# Patient Record
Sex: Female | Born: 1950
Health system: Southern US, Community
[De-identification: ages and names within clinical notes are randomized; demographics above are authoritative.]

## PROBLEM LIST (undated history)

## (undated) DIAGNOSIS — I10 Essential (primary) hypertension: Secondary | ICD-10-CM

## (undated) DIAGNOSIS — E785 Hyperlipidemia, unspecified: Secondary | ICD-10-CM

## (undated) DIAGNOSIS — K589 Irritable bowel syndrome without diarrhea: Secondary | ICD-10-CM

## (undated) HISTORY — DX: Essential (primary) hypertension: I10

## (undated) HISTORY — PX: ABDOMINAL HYSTERECTOMY: SHX81

## (undated) HISTORY — DX: Irritable bowel syndrome, unspecified: K58.9

## (undated) HISTORY — DX: Hyperlipidemia, unspecified: E78.5

## (undated) HISTORY — PX: COSMETIC SURGERY: SHX468

---

## 1999-11-13 ENCOUNTER — Encounter: Payer: Self-pay | Admitting: Emergency Medicine

## 1999-11-13 ENCOUNTER — Observation Stay (HOSPITAL_COMMUNITY): Admission: EM | Admit: 1999-11-13 | Discharge: 1999-11-14 | Payer: Self-pay | Admitting: Emergency Medicine

## 1999-11-13 ENCOUNTER — Encounter: Payer: Self-pay | Admitting: Orthopedic Surgery

## 2000-01-23 ENCOUNTER — Ambulatory Visit (HOSPITAL_BASED_OUTPATIENT_CLINIC_OR_DEPARTMENT_OTHER): Admission: RE | Admit: 2000-01-23 | Discharge: 2000-01-23 | Payer: Self-pay | Admitting: Orthopedic Surgery

## 2001-03-20 ENCOUNTER — Ambulatory Visit (HOSPITAL_BASED_OUTPATIENT_CLINIC_OR_DEPARTMENT_OTHER): Admission: RE | Admit: 2001-03-20 | Discharge: 2001-03-20 | Payer: Self-pay | Admitting: Orthopedic Surgery

## 2006-04-08 ENCOUNTER — Other Ambulatory Visit: Admission: RE | Admit: 2006-04-08 | Discharge: 2006-04-08 | Payer: Self-pay | Admitting: Obstetrics and Gynecology

## 2006-04-22 ENCOUNTER — Encounter: Admission: RE | Admit: 2006-04-22 | Discharge: 2006-04-22 | Payer: Self-pay | Admitting: Obstetrics and Gynecology

## 2006-04-29 ENCOUNTER — Encounter: Admission: RE | Admit: 2006-04-29 | Discharge: 2006-04-29 | Payer: Self-pay | Admitting: Obstetrics and Gynecology

## 2007-06-17 ENCOUNTER — Encounter: Admission: RE | Admit: 2007-06-17 | Discharge: 2007-06-17 | Payer: Self-pay | Admitting: Obstetrics and Gynecology

## 2008-06-23 ENCOUNTER — Encounter: Admission: RE | Admit: 2008-06-23 | Discharge: 2008-06-23 | Payer: Self-pay | Admitting: Obstetrics and Gynecology

## 2009-06-30 ENCOUNTER — Encounter: Admission: RE | Admit: 2009-06-30 | Discharge: 2009-06-30 | Payer: Self-pay | Admitting: Obstetrics and Gynecology

## 2009-08-03 ENCOUNTER — Encounter: Admission: RE | Admit: 2009-08-03 | Discharge: 2009-08-03 | Payer: Self-pay | Admitting: Internal Medicine

## 2010-02-04 ENCOUNTER — Encounter: Payer: Self-pay | Admitting: Obstetrics and Gynecology

## 2010-06-01 NOTE — Op Note (Signed)
[facility]  Patient:    Carla Welch, Carla Welch                       MRN: [id number] Proc. Date: 11/13/99 Adm. Date:  [id number] Attending:  Milly Jakob                           Operative Report  PREOPERATIVE DIAGNOSIS:  Trimalleolar ankle fracture and dislocation with syndesmotic disruption.  POSTOPERATIVE DIAGNOSIS:  Trimalleolar ankle fracture and dislocation with syndesmotic disruption.  PRINCIPAL PROCEDURE: 1. Open reduction and internal fixation of trimalleolar ankle fracture. 2. Open reduction and internal fixation of syndesmotic disruption.  SURGEON:  Harvie Junior, M.D.  ASSISTANT:  Currie Paris. Thedore Mins.  ANESTHESIA:  General.  BRIEF HISTORY:  The patient is a 60 year old female with a history of stepping off of a step and suffering a fracture and dislocation of the ankle.  She was evaluated in the emergency room where a consult found her to have a fracture dislocation of the ankle.  We talked about treatment options involved and the patient was taken to the operating room for open reduction and internal fixation of this.  DESCRIPTION OF PROCEDURE:  The patient was taken to the operating room and after adequate anesthesia was obtained with general anesthetic, the patient was placed supine on the operating table.  The left ankle was then prepped and draped in the usual sterile fashion and after manipulative closed reduction, an incision was made over the fibular fracture.  The subcutaneous tissue was dissected down to the level of the fibular fracture which was identified. There was a large posterior butterfly piece.  The remaining portion of the fibula was anatomically aligned and attention was then turned medially.  The medial malleolus was then exposed and very interestingly, there was a coronal split in the medial malleolus that did track around posteriorly.  The piece was not big enough for screw fixation and wire fixation was used  at this point.  Two 6-2 K-wires were advanced across the fracture site and then these were folded and cut off and advanced in to hold the fracture stable.  The joint line was anatomically reduced when this was undertaken.  At this point, attention was turned back to the lateral side where a 10-hole one-third tubular plate was used to span the butterfly fragment.  Three purchases were made above the fracture site and then four below with two empty holes.  The remaining screw hole was used for syndesmotic screw fixation.  Once the fibula was anatomically reduced, the _____ was used and with the foot in maximum dorsiflexion, a 4.0 cancellous screw was advanced across the fibula to reduce the syndesmosis and hold it reduced.  The large butterfly piece was then anatomically reduced and held in place with a Vicryl cerclage wire, two above and two below the main fracture along the fibula.  At this point, the wounds were copiously irrigated and suctioned dry.  The periosteum was then closed on the medial side with a 3-0 Vicryl interrupted suture, the subcutaneous with a 3-0 Vicryl suture, and the skin with skin staples.  On the lateral side, the subcutaneous was closed with 2-0 Vicryl and skin staples.  A sterile compressive dressing was applied as well as _____ and posterior splint.  The patient was then taken to the recovery room where she was noted to be in satisfactory condition.  Estimated blood loss  for the procedure was none. DD:  11/13/99 TD:  11/14/99 Job: [id number]

## 2010-06-01 NOTE — Op Note (Signed)
[place]  Patient:    Carla Welch, LUPA Visit Number: [id number] MRN: [id number]          Service Type: DSU Location: [facility] Attending Physician:  Milly Jakob Dictated by:   Harvie Junior, M.D. Proc. Date: 03/20/01 Admit Date:  03/20/2001 Discharge Date: 03/20/2001   CC:         Leroy Sea., M.D.   Operative Report  PREOPERATIVE DIAGNOSES: 1. Painful ankle. 2. Status post open reduction and internal fixation of severe ankle    fracture-dislocation. 3. Retained hardware medial and lateral.  POSTOPERATIVE DIAGNOSES: 1. Painful ankle. 2. Status post open reduction and internal fixation of severe ankle    fracture-dislocation. 3. Retained hardware medial and lateral. 4. Split longitudinal tear of the posterior tibial tendon.  PROCEDURES: 1. Ankle arthroscopy with debridement of chondral injury, primarily on the    medial side, and removal of scar tissue and lateral band. 2. Removal of medial hardware. 3. Removal of lateral hardware. 4. Repair of posterior tibial tendon split tear.  SURGEON:  Harvie Junior, M.D.  ASSISTANT:  Currie Paris. Thedore Mins.  ANESTHESIA:  General.  BRIEF HISTORY:  She is a 60 year old female with a long history of having a severe fracture-dislocation of the ankle.  It was ultimately treated with open reduction and internal fixation with syndesmotic screw placement and ultimately went on to have an excellent result.  She had the syndesmotic screw removed and ultimately went on to get back to ambulating.  She has still had sort of chronic achy pain, mostly on the medial side, both the anterior medial and posterior medial.  Because of continued significant pain, she was worked up with injection of the peroneal tendon sheath.  This seemed to help.  She also had injection of the joint, which seemed to help.  Because of just continued nagging, aching pain, the patient felt that ankle arthroscopy  and hardware removal was appropriate.  She is brought to the operating room for this procedure.  DESCRIPTION OF PROCEDURE:  The patient was brought to the operating room and after an adequate level of anesthesia was obtained with a general anesthetic, the patient was placed upon the operating table.  The left leg was prepped and draped in the usual sterile fashion.  Following Esmarch exsanguination, a blood pressure tourniquet was inflated to 350 mmHg.  Following this, ankle arthroscopy was performed.  Ankle arthroscopy revealed that there was a significant chondral injury on the medial side, both on the tibia, talus, and in the medial gutter.  This was debrided with a 3.5 Gator.  Attention was then turned laterally, where there was a significant lateral impinging band.  This was debrided with a straight biter and the remainder of the band was removed with a suction shaver.  Following this, a final check was made laterally.  The articular cartilage looked pretty good, including into the lateral gutter. Medially there was significant chondral injury, which was smoothed down.  At this point the ankle arthroscopy instruments were removed and the ankle was suctioned dry and attention was turned medially.  An incision was made through the old incision medially and subcutaneous tissue was taken down to the level of the posterior medial aspect.  The posterior tibial tendon sheath was opened because under [facility] guidance it felt that this was where the pins were located. Interestingly, both pins were actually within the substance of the posterior tibial tendon, the bent ends of these.  These had actually torn a longitudinal rent within the posterior tibial tendon with significant granulation tissue. At this point the pins were removed and then attention was turned toward the posterior tibial tendon, which had the significant split tendon.  At this point the wounds was copiously irrigated and  suctioned dry.  The posterior tibial tendon was then identified, and a 2-0 Vicryl running locking suture was used to repair the split tear of the posterior tibial tendon.  At this point the posterior tibial tendon sheath was then closed with 2-0 Vicryl.  An excellent repair of the sheath was obtained.  Attention was then turned laterally, where three stab incisions were made.  These were dissected down to the level of the plate, and the screws were all removed through the three stab incisions.  The plate was then removed through the inferiormost incision. Fluoroscopic guidance was used throughout to make sure that we were localizing our incisions well.  At this point the wounds were all copiously irrigated and suctioned dry.  The wounds were then closed with nylon and the portals for the arthroscopy were closed with nylon interrupted sutures with a deep suture on the anterior medial border. Dictated by:   Harvie Junior, M.D. Attending Physician:  Milly Jakob DD:  03/20/01 TD:  03/23/01 Job: [id number]

## 2010-06-12 ENCOUNTER — Other Ambulatory Visit: Payer: Self-pay | Admitting: Obstetrics and Gynecology

## 2010-06-12 DIAGNOSIS — Z1231 Encounter for screening mammogram for malignant neoplasm of breast: Secondary | ICD-10-CM

## 2010-07-06 ENCOUNTER — Ambulatory Visit
Admission: RE | Admit: 2010-07-06 | Discharge: 2010-07-06 | Disposition: A | Discharge status: Home / Self Care | Payer: BC Managed Care – PPO | Source: Ambulatory Visit | Attending: Obstetrics and Gynecology | Admitting: Obstetrics and Gynecology

## 2010-07-06 DIAGNOSIS — Z1231 Encounter for screening mammogram for malignant neoplasm of breast: Secondary | ICD-10-CM

## 2011-03-30 IMAGING — US US ABDOMEN COMPLETE
1 series · 14 of 25 positions shown · non-contrast
Comparison: None.

CLINICAL DATA: Abdominal pain, bloating.

COMPLETE ABDOMINAL ULTRASOUND

[Series 1: us abdomen complete · 0.35mm/px · 14 of 68 slices shown]
[im 1/68]
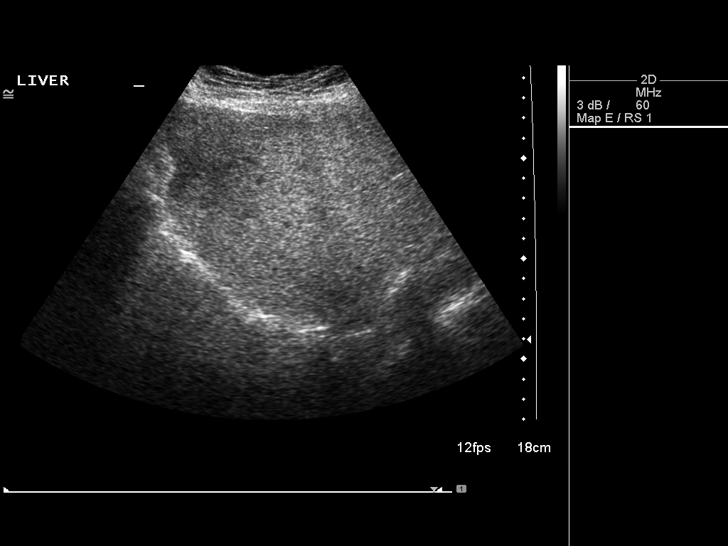
[im 6/68]
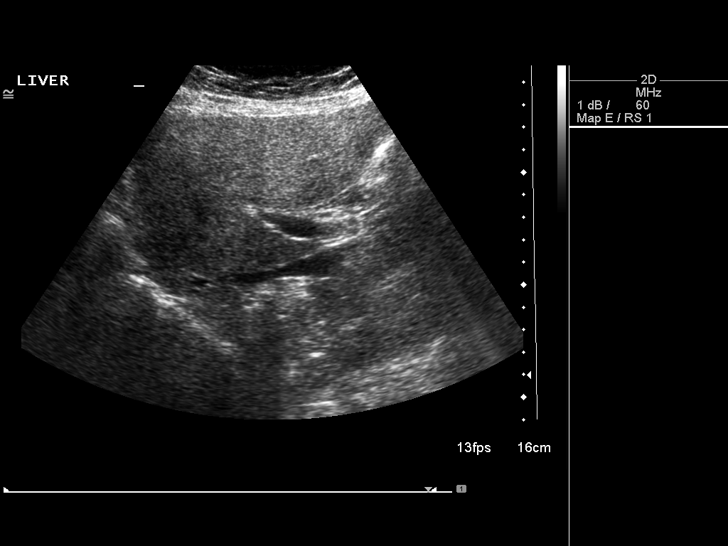
[im 12/68]
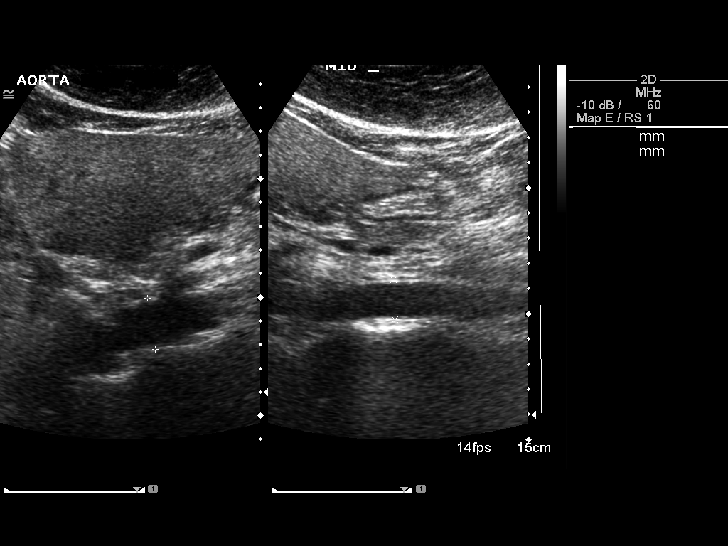
[im 17/68]
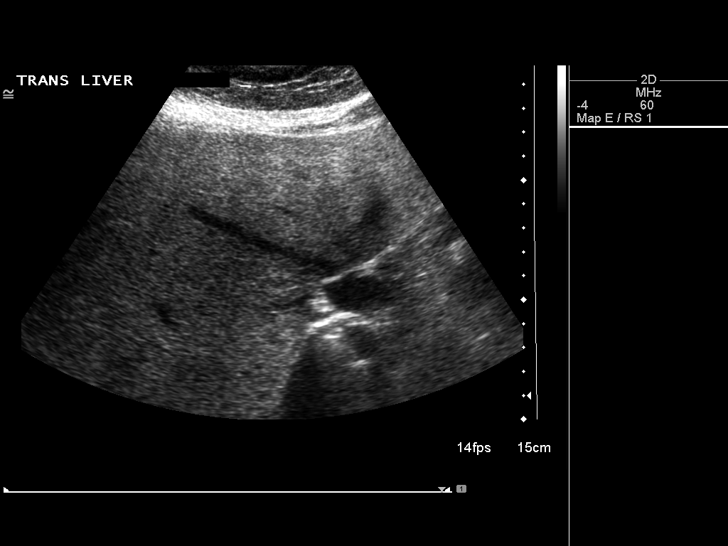
[im 23/68]
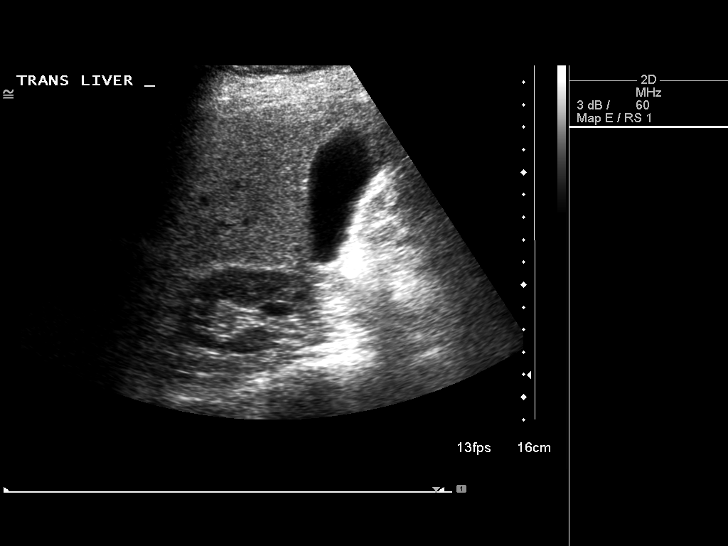
[im 26/68]
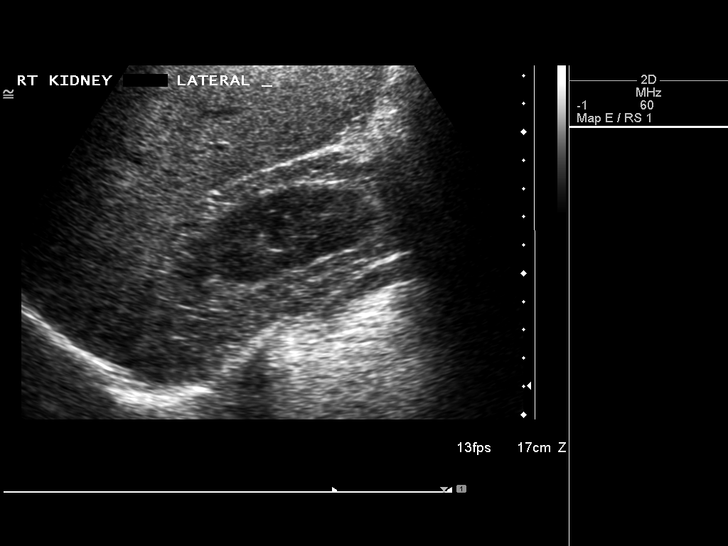
[im 31/68]
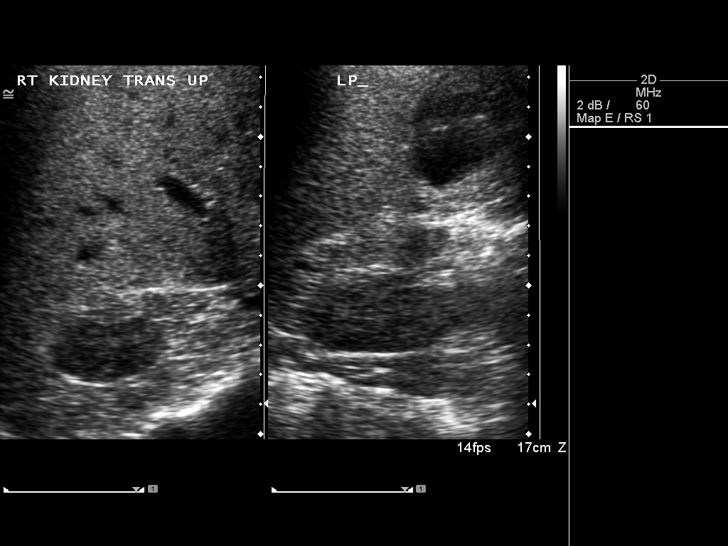
[im 37/68]
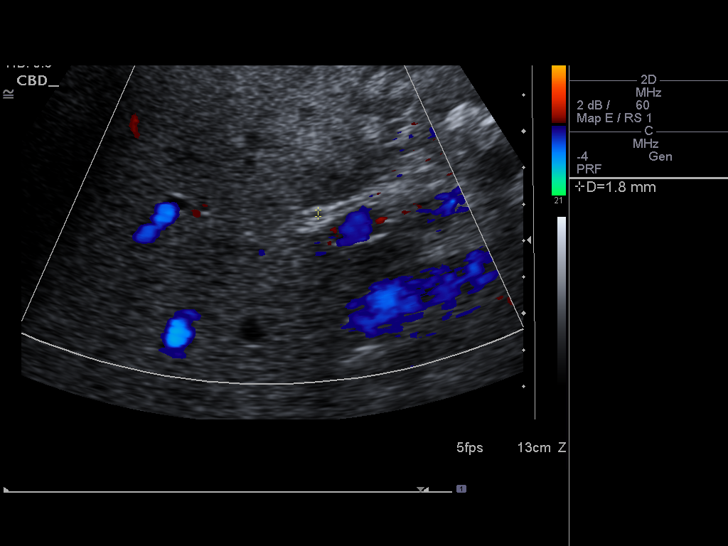
[im 42/68]
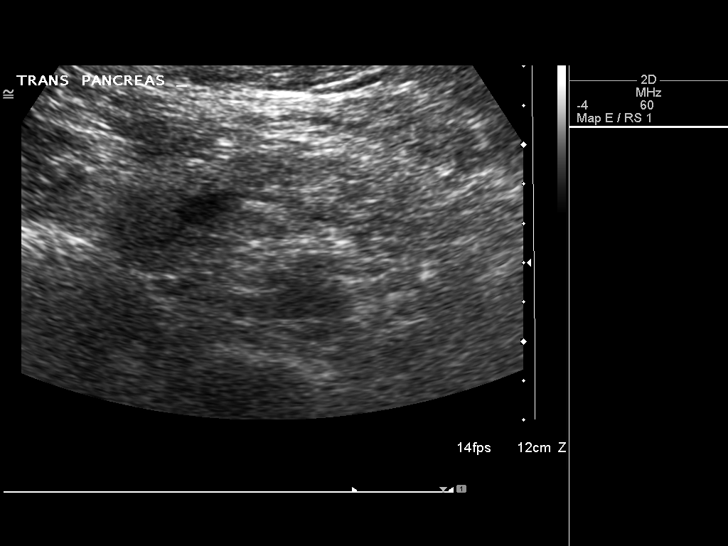
[im 45/68]
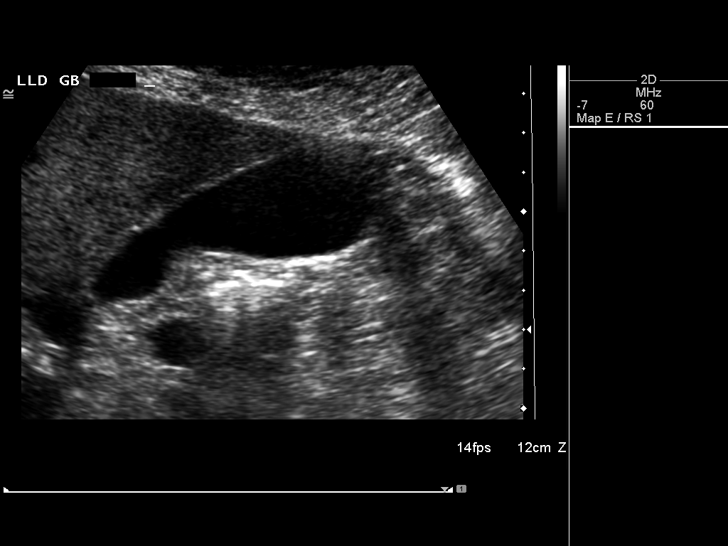
[im 51/68]
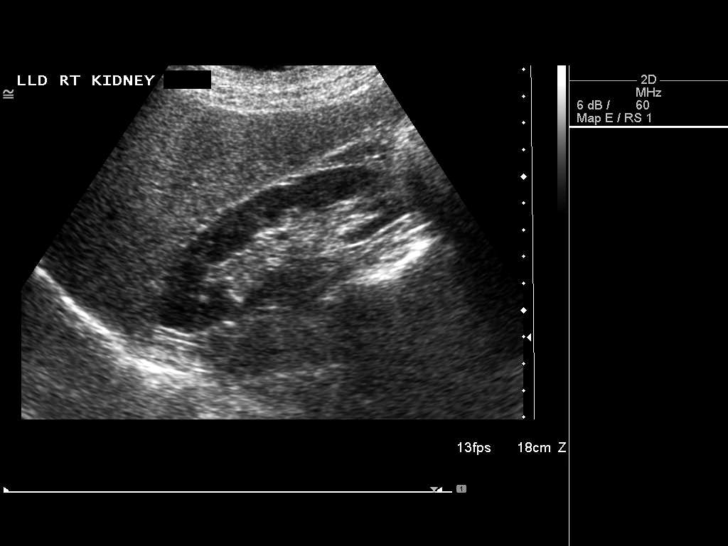
[im 56/68]
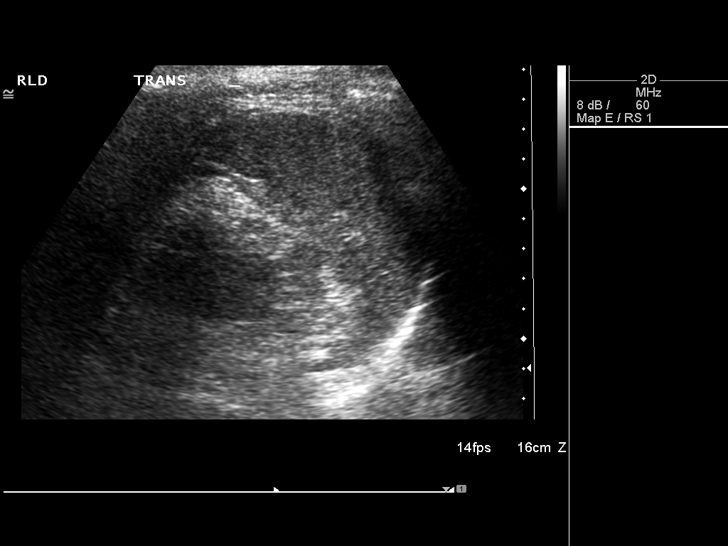
[im 62/68]
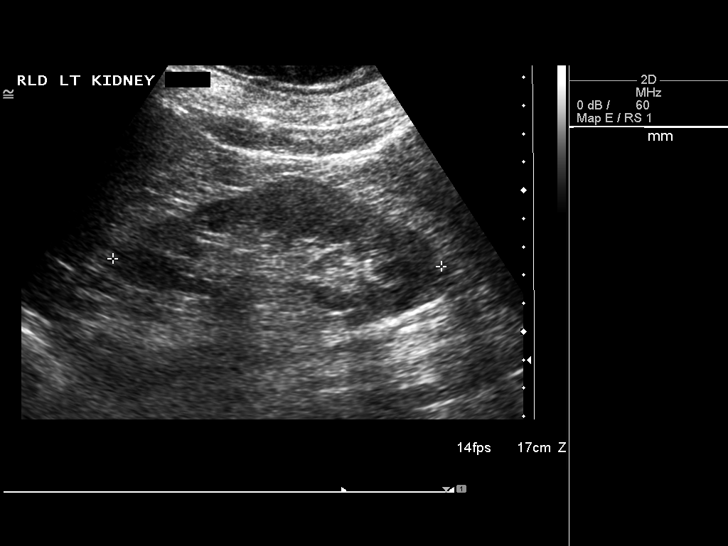
[im 68/68]
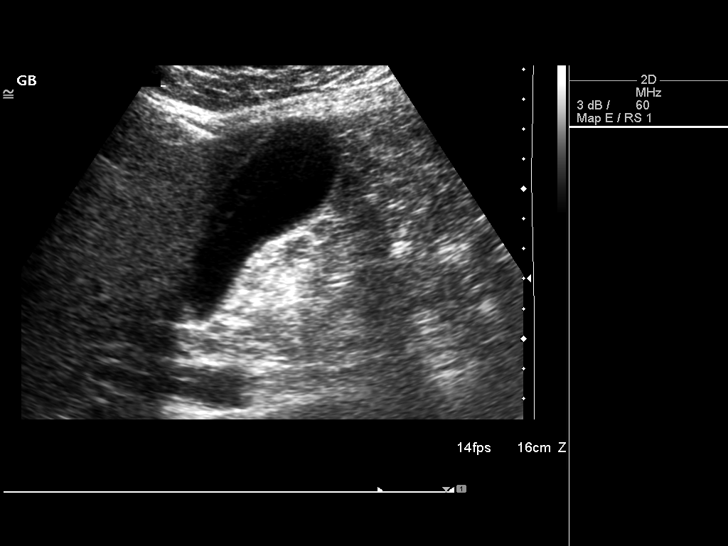

[14 of 25 positions shown; findings below may reference images not displayed]

FINDINGS: Gallbladder:  No gallstones, gallbladder wall thickening, or
pericholecystic fluid.

Common bile duct:   Within normal limits in caliber.

Liver:  Increased echotexture suggest fatty infiltration.  No focal
abnormality.

IVC:  Appears normal.

Pancreas:  No focal abnormality seen.

Spleen:  Within normal limits in size and echotexture.

Right Kidney:   Normal in size and parenchymal echogenicity.  No
evidence of mass or hydronephrosis.

Left Kidney:  Normal in size and parenchymal echogenicity.  No
evidence of mass or hydronephrosis.

Abdominal aorta:  No aneurysm identified.
IMPRESSION: Fatty infiltration of the liver.  No acute findings in the abdomen.

## 2011-04-09 ENCOUNTER — Telehealth: Payer: Self-pay

## 2011-04-09 NOTE — Telephone Encounter (Signed)
Pt needs to know if she need to come back in for bloodwork she is not sure if she understood what Dr Neva Seat was saying, she states to please contact her only on the [phone]###

## 2011-04-10 NOTE — Telephone Encounter (Signed)
spoke with pt--she will rtc for bloodwork to f/u cholestrol and hypertension.

## 2011-05-01 ENCOUNTER — Ambulatory Visit (INDEPENDENT_AMBULATORY_CARE_PROVIDER_SITE_OTHER): Payer: BC Managed Care – PPO | Admitting: Emergency Medicine

## 2011-05-01 VITALS — BP 138/74 | HR 72 | Temp 98.5°F | Resp 18 | Ht 67.5 in | Wt 173.6 lb

## 2011-05-01 DIAGNOSIS — E785 Hyperlipidemia, unspecified: Secondary | ICD-10-CM

## 2011-05-01 DIAGNOSIS — I10 Essential (primary) hypertension: Secondary | ICD-10-CM

## 2011-05-01 MED ORDER — OMEGA-3 FATTY ACIDS 1000 MG PO CAPS: 2.0000 g | ORAL_CAPSULE | Freq: Every day | ORAL | Status: DC

## 2011-05-01 MED ORDER — HYDROCHLOROTHIAZIDE 12.5 MG PO CAPS: 12.5000 mg | ORAL_CAPSULE | Freq: Every day | ORAL | Status: DC

## 2011-05-01 MED ORDER — ATORVASTATIN CALCIUM 20 MG PO TABS: 20.0000 mg | ORAL_TABLET | Freq: Every day | ORAL | Status: DC

## 2011-05-01 NOTE — Progress Notes (Signed)
  Subjective:    Patient ID: Carla Welch, female    DOB: Jun 04, 1950, 61 y.o.   MRN: [id number]  HPI patient enters for recheck on her blood pressure. His current blood pressure medication. She saw Dr. Chilton Si and was found to have an elevated cholesterol as well a slightly elevated sugar and was asked to come in for recheck. Has been watching her diet    Review of Systems she denies chest pain shortness of breath bowel problems     Objective:   Physical Exam HEENT exam is unremarkable her neck was supple chest clear heart regular rate no murmurs abdomen soft no tenderness to pressure by me manually was 120/80        Assessment & Plan:   We'll check a lipid panel hemoglobin A1c and glucose. Sugar and hemoglobin A1c are normal. Meds were refilled. She is willing to give a trial of Lipitor 20 go ahead and send this prescription to the pharmacy.

## 2011-05-02 LAB — LIPID PANEL
HDL: 54 mg/dL (ref >39)
LDL Cholesterol: 115 mg/dL — ABNORMAL HIGH (ref 0–99)
Total CHOL/HDL Ratio: 3.8 Ratio
VLDL: 38 mg/dL (ref 0–40)

## 2011-05-28 ENCOUNTER — Other Ambulatory Visit: Payer: Self-pay | Admitting: Obstetrics and Gynecology

## 2011-05-28 DIAGNOSIS — Z1231 Encounter for screening mammogram for malignant neoplasm of breast: Secondary | ICD-10-CM

## 2011-06-01 ENCOUNTER — Ambulatory Visit: Payer: BC Managed Care – PPO | Admitting: Family Medicine

## 2011-06-01 VITALS — BP 143/69 | HR 73 | Temp 97.8°F | Resp 16 | Ht 67.58 in | Wt 175.4 lb

## 2011-06-01 DIAGNOSIS — R0981 Nasal congestion: Secondary | ICD-10-CM

## 2011-06-01 DIAGNOSIS — H579 Unspecified disorder of eye and adnexa: Secondary | ICD-10-CM

## 2011-06-01 DIAGNOSIS — H5789 Other specified disorders of eye and adnexa: Secondary | ICD-10-CM

## 2011-06-01 DIAGNOSIS — J3489 Other specified disorders of nose and nasal sinuses: Secondary | ICD-10-CM

## 2011-06-01 MED ORDER — OLOPATADINE HCL 0.1 % OP SOLN: 1.0000 [drp] | Freq: Two times a day (BID) | OPHTHALMIC | Status: DC

## 2011-06-01 MED ORDER — FLUTICASONE PROPIONATE 50 MCG/ACT NA SUSP: 2.0000 | Freq: Every day | NASAL | Status: DC

## 2011-06-01 NOTE — Progress Notes (Signed)
  Patient Name: Carla Welch Date of Birth: 08-03-1950 Medical Record Number: [id number] Gender: female Date of Encounter: 06/01/2011  History of Present Illness:  Carla Welch is a 61 y.o. very pleasant female patient who presents with the following:  Has noted right eye irritation for about one month.  Her eye has intermittently felt as though there was something in it and has felt irritated.  She has contacts but has not been wearing it in her right eye- it makes her eye feel worse.  She used different rx for each eye- her right eye is used for close vision.    She has been running the hyumidifier at night recently which has helped.   She has not been to see her eye doctor.   Vision is ok.  She occasionally has some minimal crusting and discharge. Can be itchy, does not seem to get red No symptoms left eye.   No fever, cough, ST.  No known injury that she can recall.   She does cut wood sometimes.   She does not have her right contact in now.  She did switch to a new contact but this did not help.    There is no problem list on file for this patient.  No past medical history on file. No past surgical history on file. History  Substance Use Topics  . Smoking status: Never Smoker   . Smokeless tobacco: Not on file  . Alcohol Use: Not on file   No family history on file. Allergies  Allergen Reactions  . Hydrochlorothiazide Hives    Tablet form only    Medication list has been reviewed and updated.  Review of Systems: As per HPI- otherwise negative.   Physical Examination: Filed Vitals:   06/01/11 0822  BP: 143/69  Pulse: 73  Temp: 97.8 F (36.6 C)  TempSrc: Oral  Resp: 16  Height: 5' 7.58" (1.717 m)  Weight: 175 lb 6.4 oz (79.561 kg)  SpO2: 98%    Body mass index is 27.00 kg/(m^2).  GEN: WDWN, NAD, Non-toxic, A & O x 3 HEENT: Atraumatic, Normocephalic. Neck supple. No masses, No LAD. Oropharynx wnl, TM wnl, nasal cavity congested LEFT side only Ears and  Nose: No external deformity. CV: RRR, No M/G/R. No JVD. No thrill. No extra heart sounds. PULM: CTA B, no wheezes, crackles, rhonchi. No retractions. No resp. distress. No accessory muscle use. EXTR: No c/c/e NEURO Normal gait.  PSYCH: Normally interactive. Conversant. Not depressed or anxious appearing.  Calm demeanor.  Right eye exam: negative fluorescin, no abrasion noted. Conjunctivae wnl.  Bilateral eyes with PEERL, EOMI  Assessment and Plan: 1. Eye irritation  olopatadine (PATANOL) 0.1 % ophthalmic solution  2. Sinus congestion  fluticasone (FLONASE) 50 MCG/ACT nasal spray   Suspect she is having some sinus congestion and allergic conjunctivitis.  If the above measures not helpful suggested a follow- up with her eye doctor.  Please call if she is getting worse or if any other symptoms develop

## 2011-07-12 ENCOUNTER — Ambulatory Visit
Admission: RE | Admit: 2011-07-12 | Discharge: 2011-07-12 | Disposition: A | Discharge status: Home / Self Care | Payer: BC Managed Care – PPO | Source: Ambulatory Visit | Attending: Obstetrics and Gynecology | Admitting: Obstetrics and Gynecology

## 2011-07-12 DIAGNOSIS — Z1231 Encounter for screening mammogram for malignant neoplasm of breast: Secondary | ICD-10-CM

## 2012-05-20 ENCOUNTER — Ambulatory Visit (INDEPENDENT_AMBULATORY_CARE_PROVIDER_SITE_OTHER): Payer: BC Managed Care – PPO | Admitting: Emergency Medicine

## 2012-05-20 VITALS — BP 136/72 | HR 98 | Temp 97.9°F | Resp 18 | Ht 67.5 in | Wt 178.0 lb

## 2012-05-20 DIAGNOSIS — H5789 Other specified disorders of eye and adnexa: Secondary | ICD-10-CM

## 2012-05-20 DIAGNOSIS — Z9109 Other allergy status, other than to drugs and biological substances: Secondary | ICD-10-CM

## 2012-05-20 DIAGNOSIS — R319 Hematuria, unspecified: Secondary | ICD-10-CM

## 2012-05-20 DIAGNOSIS — E785 Hyperlipidemia, unspecified: Secondary | ICD-10-CM

## 2012-05-20 DIAGNOSIS — R0981 Nasal congestion: Secondary | ICD-10-CM

## 2012-05-20 DIAGNOSIS — I10 Essential (primary) hypertension: Secondary | ICD-10-CM

## 2012-05-20 DIAGNOSIS — Z0000 Encounter for general adult medical examination without abnormal findings: Secondary | ICD-10-CM

## 2012-05-20 DIAGNOSIS — K589 Irritable bowel syndrome without diarrhea: Secondary | ICD-10-CM

## 2012-05-20 LAB — LIPID PANEL
HDL: 46 mg/dL (ref >39)
LDL Cholesterol: 136 mg/dL — ABNORMAL HIGH (ref 0–99)
Triglycerides: 182 mg/dL — ABNORMAL HIGH (ref <150)
VLDL: 36 mg/dL (ref 0–40)

## 2012-05-20 LAB — COMPREHENSIVE METABOLIC PANEL
ALT: 12 U/L (ref 0–35)
AST: 15 U/L (ref 0–37)
Chloride: 99 mEq/L (ref 96–112)
Creat: 0.86 mg/dL (ref 0.50–1.10)
Total Bilirubin: 0.6 mg/dL (ref 0.3–1.2)

## 2012-05-20 LAB — POCT CBC
HCT, POC: 42.1 % (ref 37.7–47.9)
Hemoglobin: 13.3 g/dL (ref 12.2–16.2)
Lymph, poc: 1.8 (ref 0.6–3.4)
MCH, POC: 30.3 pg (ref 27–31.2)
MCV: 95.8 fL (ref 80–97)
MPV: 11.3 fL (ref 0–99.8)
POC MID %: 8.7 %M (ref 0–12)
RBC: 4.39 M/uL (ref 4.04–5.48)
WBC: 5.8 10*3/uL (ref 4.6–10.2)

## 2012-05-20 LAB — POCT UA - MICROSCOPIC ONLY
Casts, Ur, LPF, POC: NEGATIVE
Mucus, UA: NEGATIVE

## 2012-05-20 LAB — POCT URINALYSIS DIPSTICK
Glucose, UA: NEGATIVE
Spec Grav, UA: 1.025
Urobilinogen, UA: 0.2
pH, UA: 5.5

## 2012-05-20 MED ORDER — FLUTICASONE PROPIONATE 50 MCG/ACT NA SUSP: 2.0000 | Freq: Every day | NASAL | Status: DC

## 2012-05-20 MED ORDER — HYDROCHLOROTHIAZIDE 12.5 MG PO CAPS: 12.5000 mg | ORAL_CAPSULE | Freq: Every day | ORAL | Status: DC

## 2012-05-20 MED ORDER — HYOSCYAMINE SULFATE ER 0.375 MG PO TB12: 0.3750 mg | ORAL_TABLET | Freq: Two times a day (BID) | ORAL | Status: DC | PRN

## 2012-05-20 MED ORDER — OLOPATADINE HCL 0.1 % OP SOLN: 1.0000 [drp] | Freq: Two times a day (BID) | OPHTHALMIC | Status: AC

## 2012-05-20 NOTE — Progress Notes (Signed)
@[email or web address] @  Patient ID: Carla Welch MRN: [id number], DOB: 1950/03/22, 62 y.o. Date of Encounter: 05/20/2012, 8:26 AM  Primary Physician: No PCP Per Patient  Chief Complaint: Physical (CPE)  HPI: 62 y.o. y/o female with history of noted below here for CPE.  Doing well. No issues/complaints.  LMP:  Pap: MMG: Review of Systems:  Consitutional: No fever, chills, fatigue, night sweats, lymphadenopathy, or weight changes. Eyes: No visual changes, eye redness, or discharge. ENT/Mouth: Ears: No otalgia, tinnitus, hearing loss,having some discharge. Nose: No congestion, rhinorrhea, sinus pain, or epistaxis. Throat: No sore throat, post nasal drip, or teeth pain.    Cardiovascular: No CP, palpitations, diaphoresis, DOE, edema, orthopnea, PND. Respiratory: No chest pain shortness of breath or respiratory problems. Her mother did have lung cancer but was a very heavy smoker Gastrointestinal: Has a history of irritable bowel syndrome. She is up-to-date on her colonoscopies. She's been on Levsin in the past with improvement Breast: No discharge, pain, swelling, or mass. Genitourinary: No dysuria, frequency, urgency, hematuria, incontinence, nocturia, amenorrhea, vaginal discharge, pruritis, burning, abnormal bleeding, or pain. Musculoskeletal: No decreased ROM, myalgias, stiffness, joint swelling, or weakness. Skin: No rash, erythema, lesion changes, pain, warmth, jaundice, or pruritis. Neurological: No headache, dizziness, syncope, seizures, tremors, memory loss, coordination problems, or paresthesias. Psychological: No anxiety, depression, hallucinations, SI/HI. Endocrine: No fatigue, polydipsia, polyphagia, polyuria, or known diabetes. All other systems were reviewed and are otherwise negative.  Past Medical History  Diagnosis Date  . IBS (irritable bowel syndrome)      Past Surgical History  Procedure Laterality Date  . Cosmetic surgery    . Abdominal hysterectomy      Home Meds:   Prior to Admission medications   Medication Sig Start Date End Date Taking? Authorizing Provider  hydrochlorothiazide (MICROZIDE) 12.5 MG capsule Take 1 capsule (12.5 mg total) by mouth daily. 05/01/11  Yes Collene Gobble, MD  atorvastatin (LIPITOR) 20 MG tablet Take 1 tablet (20 mg total) by mouth daily. 05/01/11 04/30/12  Collene Gobble, MD  fish oil-omega-3 fatty acids 1000 MG capsule Take 2 capsules (2 g total) by mouth daily. 05/01/11   Collene Gobble, MD  fluticasone (FLONASE) 50 MCG/ACT nasal spray Place 2 sprays into the nose daily. 06/01/11 05/31/12  Gwenlyn Found Copland, MD  olopatadine (PATANOL) 0.1 % ophthalmic solution Place 1 drop into the right eye 2 (two) times daily. 06/01/11 05/31/12  Pearline Cables, MD    Allergies:  Allergies  Allergen Reactions  . Morphine And Related Anaphylaxis  . Hydrochlorothiazide Hives    Tablet form only    History   Social History  . Marital Status: Married    Spouse Name: N/A    Number of Children: N/A  . Years of Education: N/A   Occupational History  . Not on file.   Social History Main Topics  . Smoking status: Never Smoker   . Smokeless tobacco: Not on file  . Alcohol Use: No  . Drug Use: No  . Sexually Active: Yes   Other Topics Concern  . Not on file   Social History Narrative  . No narrative on file    Family History  Problem Relation Age of Onset  . Cancer Mother     lung  . Hypertension Maternal Grandmother     Physical Exam  Blood pressure 136/72, pulse 98, temperature 97.9 F (36.6 C), temperature source Oral, resp. rate 18, height 5' 7.5" (1.715 m), weight 178 lb (80.74 kg), SpO2 99.00%., Body  mass index is 27.45 kg/(m^2). General: Well developed, well nourished, in no acute distress. HEENT: Normocephalic, atraumatic. Conjunctiva pink, sclera non-icteric. Pupils 2 mm constricting to 1 mm, round, regular, and equally reactive to light and accomodation. EOMI. Internal auditory canal clear. TMs with good cone of  light and without pathology. Nasal mucosa pink. Nares are without discharge. No sinus tenderness. Oral mucosa pink. Dentition normal . Pharynx without exudate.   Neck: Supple. Trachea midline. No thyromegaly. Full ROM. No lymphadenopathy. Lungs: Clear to auscultation bilaterally without wheezes, rales, or rhonchi. Breathing is of normal effort and unlabored. Cardiovascular: RRR with S1 S2. No murmurs, rubs, or gallops appreciated. Distal pulses 2+ symmetrically. No carotid or abdominal bruits.  Breast: There are no areas of tenderness no masses are felt Abdomen: Soft, non-tender, non-distended with normoactive bowel sounds. No hepatosplenomegaly or masses. No rebound/guarding. No CVA tenderness. Without hernias.  Genitourinary: Not done  Musculoskeletal: Full range of motion and 5/5 strength throughout. Without swelling, atrophy, tenderness, crepitus, or warmth. Extremities without clubbing, cyanosis, or edema. Calves supple. Skin: Warm and moist without erythema, ecchymosis, wounds, or rash. Neuro: A+Ox3. CN II-XII grossly intact. Moves all extremities spontaneously. Full sensation throughout. Normal gait. DTR 2+ throughout upper and lower extremities. Finger to nose intact. Psych:  Responds to questions appropriately with a normal affect.        Assessment/Plan:  62 y.o. y/o female here for CPE. She is up-to-date on her screening procedures. Her blood pressure medication was refilled along with her Flonase. She was also given a refill on Levbid which she takes when she has flare of her IBS  -  Signed, Earl Lites, MD 05/20/2012 8:26 AM

## 2012-05-23 ENCOUNTER — Telehealth: Payer: Self-pay

## 2012-05-23 NOTE — Telephone Encounter (Signed)
Pt is still having trouble getting her rx -we had sent the rx to the wrong pharmacy and when she went over to get it they could not get her insurance to work so she would like for [place] to cancel the rx at [facility] and send it to [facility] on [address] and [employer] number [phone]

## 2012-05-23 NOTE — Telephone Encounter (Signed)
Called patient and she states [facility] could not process her insurance. Wants all rx called in to [facility] [facility] [address] States that's where they were supposed to go to begin with. I called [facility] and they were already closed. I will call in the morning and cxl all rx. Called in all rx to [facility] and patient was notified.

## 2012-06-09 ENCOUNTER — Other Ambulatory Visit: Payer: Self-pay

## 2012-06-09 DIAGNOSIS — Z1231 Encounter for screening mammogram for malignant neoplasm of breast: Secondary | ICD-10-CM

## 2012-07-24 ENCOUNTER — Ambulatory Visit
Admission: RE | Admit: 2012-07-24 | Discharge: 2012-07-24 | Disposition: A | Discharge status: Home / Self Care | Payer: BC Managed Care – PPO | Source: Ambulatory Visit

## 2012-07-24 DIAGNOSIS — Z1231 Encounter for screening mammogram for malignant neoplasm of breast: Secondary | ICD-10-CM

## 2013-05-31 ENCOUNTER — Telehealth: Payer: Self-pay

## 2013-05-31 NOTE — Telephone Encounter (Signed)
Would like to know if she could do a 10 day refill on her microzide;[phone]

## 2013-06-02 ENCOUNTER — Ambulatory Visit (INDEPENDENT_AMBULATORY_CARE_PROVIDER_SITE_OTHER): Payer: BC Managed Care – PPO | Admitting: Family Medicine

## 2013-06-02 ENCOUNTER — Telehealth: Payer: Self-pay

## 2013-06-02 VITALS — BP 140/90 | HR 74 | Temp 98.0°F | Resp 16 | Ht 68.0 in | Wt 177.0 lb

## 2013-06-02 DIAGNOSIS — J302 Other seasonal allergic rhinitis: Secondary | ICD-10-CM

## 2013-06-02 DIAGNOSIS — J309 Allergic rhinitis, unspecified: Secondary | ICD-10-CM

## 2013-06-02 DIAGNOSIS — H60399 Other infective otitis externa, unspecified ear: Secondary | ICD-10-CM

## 2013-06-02 DIAGNOSIS — I10 Essential (primary) hypertension: Secondary | ICD-10-CM

## 2013-06-02 DIAGNOSIS — H6090 Unspecified otitis externa, unspecified ear: Secondary | ICD-10-CM

## 2013-06-02 DIAGNOSIS — E785 Hyperlipidemia, unspecified: Secondary | ICD-10-CM

## 2013-06-02 DIAGNOSIS — Z0000 Encounter for general adult medical examination without abnormal findings: Secondary | ICD-10-CM

## 2013-06-02 LAB — LIPID PANEL
Cholesterol: 219 mg/dL — ABNORMAL HIGH (ref 0–200)
HDL: 46 mg/dL (ref >39)
LDL Cholesterol: 127 mg/dL — ABNORMAL HIGH (ref 0–99)
Total CHOL/HDL Ratio: 4.8 Ratio
Triglycerides: 231 mg/dL — ABNORMAL HIGH (ref <150)
VLDL: 46 mg/dL — ABNORMAL HIGH (ref 0–40)

## 2013-06-02 LAB — POCT URINALYSIS DIPSTICK
Bilirubin, UA: NEGATIVE
Glucose, UA: NEGATIVE
Ketones, UA: NEGATIVE
Leukocytes, UA: NEGATIVE
Nitrite, UA: NEGATIVE
Protein, UA: NEGATIVE
Spec Grav, UA: 1.025
Urobilinogen, UA: 0.2
pH, UA: 5.5

## 2013-06-02 LAB — POCT UA - MICROSCOPIC ONLY
Bacteria, U Microscopic: NEGATIVE
Casts, Ur, LPF, POC: NEGATIVE
Crystals, Ur, HPF, POC: NEGATIVE
Mucus, UA: POSITIVE
Yeast, UA: NEGATIVE

## 2013-06-02 LAB — COMPLETE METABOLIC PANEL WITHOUT GFR
Albumin: 4.5 g/dL (ref 3.5–5.2)
CO2: 29 meq/L (ref 19–32)
Calcium: 9.4 mg/dL (ref 8.4–10.5)
Chloride: 98 meq/L (ref 96–112)
GFR, Est African American: 86 mL/min
GFR, Est Non African American: 74 mL/min
Glucose, Bld: 108 mg/dL — ABNORMAL HIGH (ref 70–99)
Sodium: 137 meq/L (ref 135–145)
Total Bilirubin: 0.6 mg/dL (ref 0.2–1.2)
Total Protein: 6.6 g/dL (ref 6.0–8.3)

## 2013-06-02 LAB — POCT CBC
Granulocyte percent: 59.6 % (ref 37–80)
HCT, POC: 42.8 % (ref 37.7–47.9)
Hemoglobin: 13.7 g/dL (ref 12.2–16.2)
Lymph, poc: 2 (ref 0.6–3.4)
MCH, POC: 30.9 pg (ref 27–31.2)
MCHC: 32 g/dL (ref 31.8–35.4)
MCV: 96.7 fL (ref 80–97)
MID (cbc): 0.5 (ref 0–0.9)
MPV: 10.6 fL (ref 0–99.8)
POC Granulocyte: 3.8 (ref 2–6.9)
POC LYMPH PERCENT: 32.3 %L (ref 10–50)
POC MID %: 8.1 % (ref 0–12)
Platelet Count, POC: 258 10*3/uL (ref 142–424)
RBC: 4.43 M/uL (ref 4.04–5.48)
RDW, POC: 13.3 %
WBC: 6.3 10*3/uL (ref 4.6–10.2)

## 2013-06-02 LAB — COMPLETE METABOLIC PANEL WITH GFR
ALT: 12 U/L (ref 0–35)
AST: 10 U/L (ref 0–37)
Alkaline Phosphatase: 68 U/L (ref 39–117)
BUN: 16 mg/dL (ref 6–23)
Creat: 0.84 mg/dL (ref 0.50–1.10)
Potassium: 3.8 mEq/L (ref 3.5–5.3)

## 2013-06-02 LAB — TSH: TSH: 2.721 u[IU]/mL (ref 0.350–4.500)

## 2013-06-02 MED ORDER — CIPROFLOXACIN-DEXAMETHASONE 0.3-0.1 % OT SUSP: 4.0000 [drp] | Freq: Two times a day (BID) | OTIC | Status: DC

## 2013-06-02 MED ORDER — ATORVASTATIN CALCIUM 20 MG PO TABS: 20.0000 mg | ORAL_TABLET | Freq: Every day | ORAL | Status: DC

## 2013-06-02 MED ORDER — HYDROCHLOROTHIAZIDE 12.5 MG PO CAPS: 12.5000 mg | ORAL_CAPSULE | Freq: Every day | ORAL | Status: DC

## 2013-06-02 NOTE — Progress Notes (Signed)
 Chief Complaint:  Chief Complaint  Patient presents with  . Annual Exam    HPI: Carla Welch is a 63 y.o. female who is here for  Annual and med refills She needs it to be done under annual otherwise has to pay high deductible for OV and can't  afford UTD on all exams.  She is UTD on colonscopy Mamogam, pap, pelvic was done last June 2014, she will do it again in June with ob She is compliant with her HTN and hyperlipidemia meds, no SEs  Past Medical History  Diagnosis Date  . IBS (irritable bowel syndrome)    Past Surgical History  Procedure Laterality Date  . Cosmetic surgery    . Abdominal hysterectomy     History   Social History  . Marital Status: Married    Spouse Name: N/A    Number of Children: N/A  . Years of Education: N/A   Social History Main Topics  . Smoking status: Never Smoker   . Smokeless tobacco: None  . Alcohol Use: No  . Drug Use: No  . Sexual Activity: Yes   Other Topics Concern  . None   Social History Narrative  . None   Family History  Problem Relation Age of Onset  . Cancer Mother     lung  . Hypertension Maternal Grandmother    Allergies  Allergen Reactions  . Morphine And Related Anaphylaxis  . Hydrochlorothiazide Hives    Tablet form only   Prior to Admission medications   Medication Sig Start Date End Date Taking? Authorizing Provider  hydrochlorothiazide (MICROZIDE) 12.5 MG capsule Take 1 capsule (12.5 mg total) by mouth daily. 06/02/13  Yes  P , DO  atorvastatin (LIPITOR) 20 MG tablet Take 1 tablet (20 mg total) by mouth daily. 06/02/13 06/02/14   P , DO  ciprofloxacin-dexamethasone (CIPRODEX) otic suspension Place 4 drops into the right ear 2 (two) times daily. 06/02/13    P , DO  fluticasone (FLONASE) 50 MCG/ACT nasal spray Place 2 sprays into the nose daily. 05/20/12 05/20/13  Collene GobbleSteven A Daub, MD  hyoscyamine (LEVBID) 0.375 MG 12 hr tablet Take 1 tablet (0.375 mg total) by mouth every 12 (twelve)  hours as needed for cramping. 05/20/12   Collene GobbleSteven A Daub, MD     ROS: The patient denies fevers, chills, night sweats, unintentional weight loss, chest pain, palpitations, wheezing, dyspnea on exertion, nausea, vomiting, abdominal pain, dysuria, hematuria, melena, numbness, weakness, or tingling.   All other systems have been reviewed and were otherwise negative with the exception of those mentioned in the HPI and as above.    PHYSICAL EXAM: Filed Vitals:   06/02/13 0750  BP: 140/90  Pulse: 74  Temp: 98 F (36.7 C)  Resp: 16   Filed Vitals:   06/02/13 0750  Height: 5\' 8"  (1.727 m)  Weight: 177 lb (80.287 kg)   Body mass index is 26.92 kg/(m^2).  General: Alert, no acute distress HEENT:  Normocephalic, atraumatic, oropharynx patent. EOMI, no exudates, Right Tm nl, but narrowed ear canal with slight erythema.  Cardiovascular:  Regular rate and rhythm, no rubs murmurs or gallops.  No Carotid bruits, radial pulse intact. No pedal edema.  Respiratory: Clear to auscultation bilaterally.  No wheezes, rales, or rhonchi.  No cyanosis, no use of accessory musculature GI: No organomegaly, abdomen is soft and non-tender, positive bowel sounds.  No masses. Skin: No rashes. Neurologic: Facial musculature symmetric. Psychiatric: Patient is appropriate throughout our  interaction. Lymphatic: No cervical lymphadenopathy Musculoskeletal: Gait intact.   LABS: Results for orders placed in visit on 05/20/12  LIPID PANEL      Result Value Ref Range   Cholesterol 218 (*) 0 - 200 mg/dL   Triglycerides 161 (*) <150 mg/dL   HDL 46  >09 mg/dL   Total CHOL/HDL Ratio 4.7     VLDL 36  0 - 40 mg/dL   LDL Cholesterol 604 (*) 0 - 99 mg/dL  COMPREHENSIVE METABOLIC PANEL      Result Value Ref Range   Sodium 138  135 - 145 mEq/L   Potassium 3.8  3.5 - 5.3 mEq/L   Chloride 99  96 - 112 mEq/L   CO2 28  19 - 32 mEq/L   Glucose, Bld 103 (*) 70 - 99 mg/dL   BUN 17  6 - 23 mg/dL   Creat 5.40  9.81 - 1.91  mg/dL   Total Bilirubin 0.6  0.3 - 1.2 mg/dL   Alkaline Phosphatase 68  39 - 117 U/L   AST 15  0 - 37 U/L   ALT 12  0 - 35 U/L   Total Protein 7.2  6.0 - 8.3 g/dL   Albumin 4.5  3.5 - 5.2 g/dL   Calcium 9.2  8.4 - 47.8 mg/dL  TSH      Result Value Ref Range   TSH 2.015  0.350 - 4.500 uIU/mL  POCT CBC      Result Value Ref Range   WBC 5.8  4.6 - 10.2 K/uL   Lymph, poc 1.8  0.6 - 3.4   POC LYMPH PERCENT 30.8  10 - 50 %L   MID (cbc) 0.5  0 - 0.9   POC MID % 8.7  0 - 12 %M   POC Granulocyte 3.5  2 - 6.9   Granulocyte percent 60.5  37 - 80 %G   RBC 4.39  4.04 - 5.48 M/uL   Hemoglobin 13.3  12.2 - 16.2 g/dL   HCT, POC 29.5  62.1 - 47.9 %   MCV 95.8  80 - 97 fL   MCH, POC 30.3  27 - 31.2 pg   MCHC 31.6 (*) 31.8 - 35.4 g/dL   RDW, POC 30.8     Platelet Count, POC 231  142 - 424 K/uL   MPV 11.3  0 - 99.8 fL  POCT UA - MICROSCOPIC ONLY      Result Value Ref Range   WBC, Ur, HPF, POC neg     RBC, urine, microscopic 3-7     Bacteria, U Microscopic trace     Mucus, UA neg     Epithelial cells, urine per micros 0-1     Crystals, Ur, HPF, POC neg     Casts, Ur, LPF, POC neg     Yeast, UA neg    POCT URINALYSIS DIPSTICK      Result Value Ref Range   Color, UA yellow     Clarity, UA clear     Glucose, UA neg     Bilirubin, UA neg     Ketones, UA neg     Spec Grav, UA 1.025     Blood, UA small     pH, UA 5.5     Protein, UA neg     Urobilinogen, UA 0.2     Nitrite, UA neg     Leukocytes, UA Negative       EKG/XRAY:   [facility]   ASSESSMENT/PLAN: Encounter Diagnoses  Name Primary?  . Annual physical exam Yes  . Other and unspecified hyperlipidemia   . HTN (hypertension)   . Seasonal allergies   . Hyperlipemia   . Otitis externa    Rx ciprodex Refilled all meds for 1 year UTD on all exams Labs pending  Gross sideeffects, risk and benefits, and alternatives of medications d/w patient. Patient is aware that all medications have  potential sideeffects and we are unable to predict every sideeffect or drug-drug interaction that may occur.  nell Antuhao P , DO 06/02/2013 8:11 AM

## 2013-06-02 NOTE — Telephone Encounter (Signed)
Pt came in for OV. 

## 2013-06-02 NOTE — Telephone Encounter (Signed)
Pt called and said prescriptions from today were sent to [address] [facility] [address] of [facility] [address]. I re-ordered them.

## 2013-06-10 ENCOUNTER — Encounter: Payer: Self-pay | Admitting: Family Medicine

## 2013-06-14 ENCOUNTER — Encounter: Payer: Self-pay | Admitting: Family Medicine

## 2013-06-14 ENCOUNTER — Telehealth: Payer: Self-pay | Admitting: Family Medicine

## 2013-06-14 DIAGNOSIS — E785 Hyperlipidemia, unspecified: Secondary | ICD-10-CM

## 2013-06-14 NOTE — Telephone Encounter (Signed)
Spoke with patient about labs, apparently she is not taking her lipitor 20 mg daily, she stopped it a long time ago because it made her nauseated. Advise that she can try lipitor 1/2 tab for a total of 10 mg daily and then take fish oil 2-3 grams daily , modify diet and increase exercise. I will put in labs only orders  for her for 2 month from now to be repeated. She can call me if there is an issue with the low dose medications.

## 2013-07-12 ENCOUNTER — Other Ambulatory Visit: Payer: Self-pay

## 2013-07-12 DIAGNOSIS — Z1231 Encounter for screening mammogram for malignant neoplasm of breast: Secondary | ICD-10-CM

## 2013-08-13 ENCOUNTER — Encounter (INDEPENDENT_AMBULATORY_CARE_PROVIDER_SITE_OTHER): Payer: Self-pay

## 2013-08-13 ENCOUNTER — Ambulatory Visit
Admission: RE | Admit: 2013-08-13 | Discharge: 2013-08-13 | Disposition: A | Discharge status: Home / Self Care | Payer: BC Managed Care – PPO | Source: Ambulatory Visit

## 2013-08-13 DIAGNOSIS — Z1231 Encounter for screening mammogram for malignant neoplasm of breast: Secondary | ICD-10-CM

## 2014-06-08 ENCOUNTER — Ambulatory Visit (INDEPENDENT_AMBULATORY_CARE_PROVIDER_SITE_OTHER): Payer: BLUE CROSS/BLUE SHIELD | Admitting: Physician Assistant

## 2014-06-08 VITALS — BP 126/66 | HR 84 | Temp 98.8°F | Resp 16 | Ht 67.0 in | Wt 187.0 lb

## 2014-06-08 DIAGNOSIS — Z130 Encounter for screening for diseases of the blood and blood-forming organs and certain disorders involving the immune mechanism: Secondary | ICD-10-CM | POA: Diagnosis not present

## 2014-06-08 DIAGNOSIS — Z23 Encounter for immunization: Secondary | ICD-10-CM

## 2014-06-08 DIAGNOSIS — J302 Other seasonal allergic rhinitis: Secondary | ICD-10-CM | POA: Diagnosis not present

## 2014-06-08 DIAGNOSIS — Z0000 Encounter for general adult medical examination without abnormal findings: Secondary | ICD-10-CM | POA: Diagnosis not present

## 2014-06-08 DIAGNOSIS — E785 Hyperlipidemia, unspecified: Secondary | ICD-10-CM

## 2014-06-08 DIAGNOSIS — I10 Essential (primary) hypertension: Secondary | ICD-10-CM | POA: Insufficient documentation

## 2014-06-08 LAB — LIPID PANEL
Cholesterol: 215 mg/dL — ABNORMAL HIGH (ref 0–200)
HDL: 44 mg/dL — ABNORMAL LOW (ref >=46)
LDL Cholesterol: 126 mg/dL — ABNORMAL HIGH (ref 0–99)
Total CHOL/HDL Ratio: 4.9 Ratio
Triglycerides: 224 mg/dL — ABNORMAL HIGH (ref <150)
VLDL: 45 mg/dL — ABNORMAL HIGH (ref 0–40)

## 2014-06-08 LAB — COMPREHENSIVE METABOLIC PANEL
ALT: 14 U/L (ref 0–35)
AST: 14 U/L (ref 0–37)
Albumin: 4.3 g/dL (ref 3.5–5.2)
Alkaline Phosphatase: 77 U/L (ref 39–117)
BILIRUBIN TOTAL: 0.5 mg/dL (ref 0.2–1.2)
BUN: 17 mg/dL (ref 6–23)
CALCIUM: 9.4 mg/dL (ref 8.4–10.5)
CHLORIDE: 101 meq/L (ref 96–112)
CO2: 27 mEq/L (ref 19–32)
Creat: 0.81 mg/dL (ref 0.50–1.10)
Glucose, Bld: 104 mg/dL — ABNORMAL HIGH (ref 70–99)
Potassium: 4 mEq/L (ref 3.5–5.3)
Sodium: 137 mEq/L (ref 135–145)
Total Protein: 7.4 g/dL (ref 6.0–8.3)

## 2014-06-08 LAB — CBC
HEMATOCRIT: 39.9 % (ref 36.0–46.0)
Hemoglobin: 13.3 g/dL (ref 12.0–15.0)
MCH: 31.2 pg (ref 26.0–34.0)
MCHC: 33.3 g/dL (ref 30.0–36.0)
MCV: 93.7 fL (ref 78.0–100.0)
MPV: 11.2 fL (ref 8.6–12.4)
Platelets: 245 10*3/uL (ref 150–400)
RBC: 4.26 MIL/uL (ref 3.87–5.11)
RDW: 13 % (ref 11.5–15.5)
WBC: 5.7 10*3/uL (ref 4.0–10.5)

## 2014-06-08 MED ORDER — ZOSTER VACCINE LIVE 19400 UNT/0.65ML ~~LOC~~ SOLR: 0.6500 mL | Freq: Once | SUBCUTANEOUS | Status: DC

## 2014-06-08 MED ORDER — HYDROCHLOROTHIAZIDE 12.5 MG PO CAPS: 12.5000 mg | ORAL_CAPSULE | Freq: Every day | ORAL | Status: DC

## 2014-06-08 NOTE — Patient Instructions (Signed)
We've refilled your hctz for one year. Please start your exercise routine to try to keep your bp down.  We're checking labs today and I'll be in touch with you with those results.  You received the tdap vaccine today. Please take the shingles vaccine to the pharmacy. We'll discuss cholesterol options when your labs get back.   Health Maintenance Adopting a healthy lifestyle and getting preventive care can go a long way to promote health and wellness. Talk with your health care provider about what schedule of regular examinations is right for you. This is a good chance for you to check in with your provider about disease prevention and staying healthy. In between checkups, there are plenty of things you can do on your own. Experts have done a lot of research about which lifestyle changes and preventive measures are most likely to keep you healthy. Ask your health care provider for more information. WEIGHT AND DIET  Eat a healthy diet  Be sure to include plenty of vegetables, fruits, low-fat dairy products, and lean protein.  Do not eat a lot of foods high in solid fats, added sugars, or salt.  Get regular exercise. This is one of the most important things you can do for your health.  Most adults should exercise for at least 150 minutes each week. The exercise should increase your heart rate and make you sweat (moderate-intensity exercise).  Most adults should also do strengthening exercises at least twice a week. This is in addition to the moderate-intensity exercise.  Maintain a healthy weight  Body mass index (BMI) is a measurement that can be used to identify possible weight problems. It estimates body fat based on height and weight. Your health care provider can help determine your BMI and help you achieve or maintain a healthy weight.  For females 43 years of age and older:   A BMI below 18.5 is considered underweight.  A BMI of 18.5 to 24.9 is normal.  A BMI of 25 to 29.9 is  considered overweight.  A BMI of 30 and above is considered obese.  Watch levels of cholesterol and blood lipids  You should start having your blood tested for lipids and cholesterol at 65 years of age, then have this test every 5 years.  You may need to have your cholesterol levels checked more often if:  Your lipid or cholesterol levels are high.  You are older than 65 years of age.  You are at high risk for heart disease.  CANCER SCREENING   Lung Cancer  Lung cancer screening is recommended for adults 46-81 years old who are at high risk for lung cancer because of a history of smoking.  A yearly low-dose CT scan of the lungs is recommended for people who:  Currently smoke.  Have quit within the past 15 years.  Have at least a 30-pack-year history of smoking. A pack year is smoking an average of one pack of cigarettes a day for 1 year.  Yearly screening should continue until it has been 15 years since you quit.  Yearly screening should stop if you develop a health problem that would prevent you from having lung cancer treatment.  Breast Cancer  Practice breast self-awareness. This means understanding how your breasts normally appear and feel.  It also means doing regular breast self-exams. Let your health care provider know about any changes, no matter how small.  If you are in your 20s or 30s, you should have a clinical breast exam (  CBE) by a health care provider every 1-3 years as part of a regular health exam.  If you are 56 or older, have a CBE every year. Also consider having a breast X-ray (mammogram) every year.  If you have a family history of breast cancer, talk to your health care provider about genetic screening.  If you are at high risk for breast cancer, talk to your health care provider about having an MRI and a mammogram every year.  Breast cancer gene (BRCA) assessment is recommended for women who have family members with BRCA-related cancers.  BRCA-related cancers include:  Breast.  Ovarian.  Tubal.  Peritoneal cancers.  Results of the assessment will determine the need for genetic counseling and BRCA1 and BRCA2 testing. Cervical Cancer Routine pelvic examinations to screen for cervical cancer are no longer recommended for nonpregnant women who are considered low risk for cancer of the pelvic organs (ovaries, uterus, and vagina) and who do not have symptoms. A pelvic examination may be necessary if you have symptoms including those associated with pelvic infections. Ask your health care provider if a screening pelvic exam is right for you.   The Pap test is the screening test for cervical cancer for women who are considered at risk.  If you had a hysterectomy for a problem that was not cancer or a condition that could lead to cancer, then you no longer need Pap tests.  If you are older than 65 years, and you have had normal Pap tests for the past 10 years, you no longer need to have Pap tests.  If you have had past treatment for cervical cancer or a condition that could lead to cancer, you need Pap tests and screening for cancer for at least 20 years after your treatment.  If you no longer get a Pap test, assess your risk factors if they change (such as having a new sexual partner). This can affect whether you should start being screened again.  Some women have medical problems that increase their chance of getting cervical cancer. If this is the case for you, your health care provider may recommend more frequent screening and Pap tests.  The human papillomavirus (HPV) test is another test that may be used for cervical cancer screening. The HPV test looks for the virus that can cause cell changes in the cervix. The cells collected during the Pap test can be tested for HPV.  The HPV test can be used to screen women 39 years of age and older. Getting tested for HPV can extend the interval between normal Pap tests from three to  five years.  An HPV test also should be used to screen women of any age who have unclear Pap test results.  After 65 years of age, women should have HPV testing as often as Pap tests.  Colorectal Cancer  This type of cancer can be detected and often prevented.  Routine colorectal cancer screening usually begins at 65 years of age and continues through 65 years of age.  Your health care provider may recommend screening at an earlier age if you have risk factors for colon cancer.  Your health care provider may also recommend using home test kits to check for hidden blood in the stool.  A small camera at the end of a tube can be used to examine your colon directly (sigmoidoscopy or colonoscopy). This is done to check for the earliest forms of colorectal cancer.  Routine screening usually begins at age 77.  Direct examination of the colon should be repeated every 5-10 years through 65 years of age. However, you may need to be screened more often if early forms of precancerous polyps or small growths are found. Skin Cancer  Check your skin from head to toe regularly.  Tell your health care provider about any new moles or changes in moles, especially if there is a change in a mole's shape or color.  Also tell your health care provider if you have a mole that is larger than the size of a pencil eraser.  Always use sunscreen. Apply sunscreen liberally and repeatedly throughout the day.  Protect yourself by wearing long sleeves, pants, a wide-brimmed hat, and sunglasses whenever you are outside. HEART DISEASE, DIABETES, AND HIGH BLOOD PRESSURE   Have your blood pressure checked at least every 1-2 years. High blood pressure causes heart disease and increases the risk of stroke.  If you are between 76 years and 55 years old, ask your health care provider if you should take aspirin to prevent strokes.  Have regular diabetes screenings. This involves taking a blood sample to check your  fasting blood sugar level.  If you are at a normal weight and have a low risk for diabetes, have this test once every three years after 65 years of age.  If you are overweight and have a high risk for diabetes, consider being tested at a younger age or more often. PREVENTING INFECTION  Hepatitis B  If you have a higher risk for hepatitis B, you should be screened for this virus. You are considered at high risk for hepatitis B if:  You were born in a country where hepatitis B is common. Ask your health care provider which countries are considered high risk.  Your parents were born in a high-risk country, and you have not been immunized against hepatitis B (hepatitis B vaccine).  You have HIV or AIDS.  You use needles to inject street drugs.  You live with someone who has hepatitis B.  You have had sex with someone who has hepatitis B.  You get hemodialysis treatment.  You take certain medicines for conditions, including cancer, organ transplantation, and autoimmune conditions. Hepatitis C  Blood testing is recommended for:  Everyone born from [birth year] through 1966.  Anyone with known risk factors for hepatitis C. Sexually transmitted infections (STIs)  You should be screened for sexually transmitted infections (STIs) including gonorrhea and chlamydia if:  You are sexually active and are younger than 65 years of age.  You are older than 66 years of age and your health care provider tells you that you are at risk for this type of infection.  Your sexual activity has changed since you were last screened and you are at an increased risk for chlamydia or gonorrhea. Ask your health care provider if you are at risk.  If you do not have HIV, but are at risk, it may be recommended that you take a prescription medicine daily to prevent HIV infection. This is called pre-exposure prophylaxis (PrEP). You are considered at risk if:  You are sexually active and do not regularly use condoms or  know the HIV status of your partner(s).  You take drugs by injection.  You are sexually active with a partner who has HIV. Talk with your health care provider about whether you are at high risk of being infected with HIV. If you choose to begin PrEP, you should first be tested for HIV. You should then be  tested every 3 months for as long as you are taking PrEP.  PREGNANCY   If you are premenopausal and you may become pregnant, ask your health care provider about preconception counseling.  If you may become pregnant, take 400 to 800 micrograms (mcg) of folic acid every day.  If you want to prevent pregnancy, talk to your health care provider about birth control (contraception). OSTEOPOROSIS AND MENOPAUSE   Osteoporosis is a disease in which the bones lose minerals and strength with aging. This can result in serious bone fractures. Your risk for osteoporosis can be identified using a bone density scan.  If you are 25 years of age or older, or if you are at risk for osteoporosis and fractures, ask your health care provider if you should be screened.  Ask your health care provider whether you should take a calcium or vitamin D supplement to lower your risk for osteoporosis.  Menopause may have certain physical symptoms and risks.  Hormone replacement therapy may reduce some of these symptoms and risks. Talk to your health care provider about whether hormone replacement therapy is right for you.  HOME CARE INSTRUCTIONS   Schedule regular health, dental, and eye exams.  Stay current with your immunizations.   Do not use any tobacco products including cigarettes, chewing tobacco, or electronic cigarettes.  If you are pregnant, do not drink alcohol.  If you are breastfeeding, limit how much and how often you drink alcohol.  Limit alcohol intake to no more than 1 drink per day for nonpregnant women. One drink equals 12 ounces of beer, 5 ounces of wine, or 1 ounces of hard liquor.  Do  not use street drugs.  Do not share needles.  Ask your health care provider for help if you need support or information about quitting drugs.  Tell your health care provider if you often feel depressed.  Tell your health care provider if you have ever been abused or do not feel safe at home. Document Released: 07/16/2010 Document Revised: 05/17/2013 Document Reviewed: 12/02/2012 [facility] Patient Information 2015 [place], [place]. This information is not intended to replace advice given to you by your health care provider. Make sure you discuss any questions you have with your health care provider.

## 2014-06-08 NOTE — Progress Notes (Signed)
Subjective:    Patient ID: Carla Welch, female    DOB: July 03, 1950, 64 y.o.   MRN: [id number]  Chief Complaint  Patient presents with  . Annual Exam  . Medication Refill    BP meds   Patient Active Problem List   Diagnosis Date Noted  . Essential hypertension 06/08/2014  . Hyperlipidemia 06/08/2014   Prior to Admission medications   Medication Sig Start Date End Date Taking? Authorizing Provider  hydrochlorothiazide (MICROZIDE) 12.5 MG capsule Take 1 capsule (12.5 mg total) by mouth daily. 06/08/14  Yes Janiyah Beery, PA  hyoscyamine (LEVBID) 0.375 MG 12 hr tablet Take 1 tablet (0.375 mg total) by mouth every 12 (twelve) hours as needed for cramping. 05/20/12  Yes Collene Gobble, MD  fluticasone (FLONASE) 50 MCG/ACT nasal spray Place 2 sprays into the nose daily. 05/20/12 05/20/13  Collene Gobble, MD  zoster vaccine live, PF, (ZOSTAVAX) 40981 UNT/0.65ML injection Inject 19,400 Units into the skin once. 06/08/14   Raelyn Ensign, PA   Medications, allergies, past medical history, surgical history, family history, social history and problem list reviewed and updated.  HPI  64 yof with pmh htn, high cho presents for cpe and needing refills.   Has been doing well since last visit with [place] one year ago. She is still working and watching her grandkids full time.   She does not exercise as she feels she doesn't have time. She does not eat a particular diet. She sees a Education officer, community regularly.   Takes her hctz daily. She stopped taking her statin several months ago as she was having muscles aches. She was planning to start exercising and watching diet but did not. She would like to check cho today and see if she needs to try a diff statin. Taking fish oil daily.   Preventative: Had colonoscopy at age 19, not due till age 64. Had mammo last year at [facility] center, normal per pt. Had normal pap 12/15 with gynecology per pt normal.   Vaccinations: Unsure of last tdap. Thinks greater than 10 years. Interested  in shingles vaccine. She is aware she'll start pneumonia vaccines next year.   She has hx blood in urine, had small amnt blood in urine with 3-7 rbcs on ua last year for her cpe. She states she saw Dr Letha Cape 2-3 yrs ago with [facility] and workup was negative, she does not need further workup for this per pt.   Review of Systems Negative per ROS sheet.     Objective:   Physical Exam  Constitutional: She is oriented to person, place, and time. She appears well-developed and well-nourished.  Non-toxic appearance. She does not have a sickly appearance. She does not appear ill. No distress.  BP 126/66 mmHg  Pulse 84  Temp(Src) 98.8 F (37.1 C)  Resp 16  Ht  (1.702 m)  Wt 187 lb (84.823 kg)  BMI 29.28 kg/m2  SpO2 99%   HENT:  Right Ear: Tympanic membrane normal.  Left Ear: Tympanic membrane normal.  Mouth/Throat: Uvula is midline and oropharynx is clear and moist.  Eyes: Conjunctivae and EOM are normal. Pupils are equal, round, and reactive to light.  Neck: Normal range of motion. Carotid bruit is not present. No thyroid mass and no thyromegaly present.  Cardiovascular: Normal rate, regular rhythm and normal heart sounds.   Pulses:      Posterior tibial pulses are 2+ on the right side, and 2+ on the left side.  Pulmonary/Chest: Effort normal  and breath sounds normal.  Abdominal: Soft. Normal appearance and bowel sounds are normal. There is no hepatosplenomegaly. There is no tenderness.  Musculoskeletal: Normal range of motion.  Neurological: She is alert and oriented to person, place, and time. She has normal strength. No cranial nerve deficit or sensory deficit.  Psychiatric: She has a normal mood and affect. Her speech is normal and behavior is normal.      Assessment & Plan:   [age] yof with pmh htn, high cho presents for cpe and needing refills.   Annual physical exam --normal vitals, exam, ros --repeat one year, sooner with issues  Need for Tdap vaccination -  Plan: Tdap vaccine greater than or equal to 7yo IM  Essential hypertension - Plan: Comprehensive metabolic panel, hydrochlorothiazide (MICROZIDE) 12.5 MG capsule --refilled one year, checking bun/cr/k today --encouraged exercise  Hyperlipidemia - Plan: Lipid panel --checking lipids today, has not been on statin past few months due to muscle aches  Screening for deficiency anemia - Plan: CBC  Need for shingles vaccine - Plan: zoster vaccine live, PF, (ZOSTAVAX) [id number] UNT/0.65ML injection   Donnajean Lopesodd M. Renley Banwart, PA-C Physician Assistant-Certified [facility]  06/08/2014 9:09 AM

## 2014-06-14 ENCOUNTER — Encounter: Payer: Self-pay | Admitting: Family Medicine

## 2014-07-11 ENCOUNTER — Other Ambulatory Visit: Payer: Self-pay

## 2014-07-11 DIAGNOSIS — Z1231 Encounter for screening mammogram for malignant neoplasm of breast: Secondary | ICD-10-CM

## 2014-09-02 ENCOUNTER — Ambulatory Visit
Admission: RE | Admit: 2014-09-02 | Discharge: 2014-09-02 | Disposition: A | Discharge status: Home / Self Care | Payer: BLUE CROSS/BLUE SHIELD | Source: Ambulatory Visit

## 2014-09-02 DIAGNOSIS — Z1231 Encounter for screening mammogram for malignant neoplasm of breast: Secondary | ICD-10-CM

## 2015-06-14 ENCOUNTER — Ambulatory Visit (INDEPENDENT_AMBULATORY_CARE_PROVIDER_SITE_OTHER): Payer: Medicare Other | Admitting: Urgent Care

## 2015-06-14 VITALS — BP 138/80 | HR 77 | Temp 97.9°F | Resp 16 | Ht 67.0 in | Wt 172.0 lb

## 2015-06-14 DIAGNOSIS — I10 Essential (primary) hypertension: Secondary | ICD-10-CM

## 2015-06-14 DIAGNOSIS — J309 Allergic rhinitis, unspecified: Secondary | ICD-10-CM | POA: Diagnosis not present

## 2015-06-14 DIAGNOSIS — J302 Other seasonal allergic rhinitis: Secondary | ICD-10-CM

## 2015-06-14 DIAGNOSIS — Z23 Encounter for immunization: Secondary | ICD-10-CM | POA: Diagnosis not present

## 2015-06-14 DIAGNOSIS — Z0000 Encounter for general adult medical examination without abnormal findings: Secondary | ICD-10-CM

## 2015-06-14 DIAGNOSIS — E785 Hyperlipidemia, unspecified: Secondary | ICD-10-CM | POA: Diagnosis not present

## 2015-06-14 LAB — LIPID PANEL
CHOLESTEROL: 209 mg/dL — AB (ref 125–200)
HDL: 54 mg/dL (ref >=46)
LDL Cholesterol: 118 mg/dL (ref <130)
Total CHOL/HDL Ratio: 3.9 Ratio (ref <=5.0)
Triglycerides: 183 mg/dL — ABNORMAL HIGH (ref <150)
VLDL: 37 mg/dL — ABNORMAL HIGH (ref <30)

## 2015-06-14 LAB — COMPREHENSIVE METABOLIC PANEL
ALBUMIN: 4.5 g/dL (ref 3.6–5.1)
ALK PHOS: 72 U/L (ref 33–130)
ALT: 9 U/L (ref 6–29)
AST: 13 U/L (ref 10–35)
BILIRUBIN TOTAL: 0.6 mg/dL (ref 0.2–1.2)
BUN: 19 mg/dL (ref 7–25)
CO2: 26 mmol/L (ref 20–31)
CREATININE: 0.81 mg/dL (ref 0.50–0.99)
Calcium: 9.5 mg/dL (ref 8.6–10.4)
Chloride: 99 mmol/L (ref 98–110)
Glucose, Bld: 92 mg/dL (ref 65–99)
Potassium: 4.1 mmol/L (ref 3.5–5.3)
SODIUM: 137 mmol/L (ref 135–146)
TOTAL PROTEIN: 7.1 g/dL (ref 6.1–8.1)

## 2015-06-14 LAB — CBC
HCT: 42.3 % (ref 35.0–45.0)
HEMOGLOBIN: 14.2 g/dL (ref 11.7–15.5)
MCH: 31.3 pg (ref 27.0–33.0)
MCHC: 33.6 g/dL (ref 32.0–36.0)
MCV: 93.4 fL (ref 80.0–100.0)
MPV: 11.4 fL (ref 7.5–12.5)
PLATELETS: 246 10*3/uL (ref 140–400)
RBC: 4.53 MIL/uL (ref 3.80–5.10)
RDW: 12.9 % (ref 11.0–15.0)
WBC: 6.2 10*3/uL (ref 3.8–10.8)

## 2015-06-14 LAB — TSH: TSH: 2.95 m[IU]/L

## 2015-06-14 MED ORDER — HYDROCHLOROTHIAZIDE 12.5 MG PO CAPS: 12.5000 mg | ORAL_CAPSULE | Freq: Every day | ORAL | Status: DC

## 2015-06-14 NOTE — Progress Notes (Signed)
MRN: [id number]  Subjective:   [name] is a 65 y.o. female presenting for annual physical exam.  Medical care team includes: PCP: No PCP Per Patient Vision: Wears contacts, has an eye exam yearly. Dental: Does not get consistent dental cleanings. OB/GYN: Last pap smear was 12/2013, was normal. Last mammogram was 07/2014, was normal. Specialists: Her last colonoscopy was 2011, was normal. She is on 10 year follow up.  Patient is married, has 1 adult daughter. Denies smoking cigarettes or drinking alcohol. Patient tries to eat healthily due to her IBS, does not exercise. She has concerns about her thyroid. Her family is also undergoing emotional stress right now. The patient's nephew is very ill, has cirrhosis and has already lost a niece many years ago. She is not currently seeking help for anxiety and depression.   Carla Welch has Essential hypertension and Hyperlipidemia on her problem list.  Carla Welch has a current medication list which includes the following prescription(s): hydrochlorothiazide and fluticasone. She is allergic to morphine and related and hydrochlorothiazide.  Carla Welch  has a past medical history of IBS (irritable bowel syndrome); Hypertension; and Hyperlipidemia. Also  has past surgical history that includes Cosmetic surgery and Abdominal hysterectomy.  His family history includes Cancer in her mother; Hypertension in her maternal grandmother.  Immunizations: Last TDAP 06/08/2014, Zostavax 2015  Review of Systems  Constitutional: Negative for fever, chills, weight loss, malaise/fatigue and diaphoresis.  HENT: Negative for congestion, ear discharge, ear pain, hearing loss, nosebleeds, sore throat and tinnitus.   Eyes: Negative for blurred vision, double vision, photophobia, pain, discharge and redness.  Respiratory: Negative for cough, shortness of breath and wheezing.   Cardiovascular: Negative for chest pain, palpitations and leg swelling.  Gastrointestinal:  Negative for nausea, vomiting, abdominal pain, diarrhea, constipation and blood in stool.  Genitourinary: Negative for dysuria, urgency, frequency, hematuria and flank pain.  Musculoskeletal: Negative for myalgias, back pain and joint pain.  Skin: Negative for itching and rash.  Neurological: Negative for dizziness, tingling, seizures, loss of consciousness, weakness and headaches.  Endo/Heme/Allergies: Positive for environmental allergies (mold). Negative for polydipsia.  Psychiatric/Behavioral: Negative for depression, suicidal ideas, hallucinations, memory loss and substance abuse. The patient is not nervous/anxious and does not have insomnia.    Objective:   Vitals: BP 138/80 mmHg  Pulse 77  Temp(Src) 97.9 F (36.6 C) (Oral)  Resp 16  Ht 5\' 7"  (1.702 m)  Wt 172 lb (78.019 kg)  BMI 26.93 kg/m2  SpO2 97%  Physical Exam  Constitutional: She is oriented to person, place, and time. She appears well-developed and well-nourished.  HENT:  TM's intact bilaterally, no effusions or erythema. Nasal turbinates pink and moist, nasal passages patent. No sinus tenderness. Oropharynx clear, mucous membranes moist, dentition in good repair.  Eyes: Conjunctivae and EOM are normal. Pupils are equal, round, and reactive to light. Right eye exhibits no discharge. Left eye exhibits no discharge. No scleral icterus.  Neck: Normal range of motion. Neck supple. No thyromegaly present.  Cardiovascular: Normal rate, regular rhythm and intact distal pulses.  Exam reveals no gallop and no friction rub.   No murmur heard. Pulmonary/Chest: No respiratory distress. She has no wheezes. She has no rales.  Abdominal: Soft. Bowel sounds are normal. She exhibits no distension and no mass. There is no tenderness.  Musculoskeletal: Normal range of motion. She exhibits no edema or tenderness.  Lymphadenopathy:    She has no cervical adenopathy.  Neurological: She is alert and oriented to person, place,  and time. She  has normal reflexes.  Skin: Skin is warm and dry. No rash noted. No erythema. No pallor.  Psychiatric: She has a normal mood and affect.   Assessment and Plan :   1. Annual physical exam - Pleasant lady, medically healthy, labs pending. I offered our help in case she needs it for coping with family illness. Patient verbalized understanding. Discussed healthy lifestyle, diet, exercise, preventative care, vaccinations, and addressed patient's concerns.    2. Essential hypertension - Controlled, refill provided. Recheck in 6 months - 1 year.  3. Seasonal allergies - Controlled, continue Flonase PRN  4. Hyperlipidemia - Lipid panel pending  5. Need for pneumococcal vaccination - Pneumococcal conjugate vaccine 13-valent IM   Wallis Bamberg, PA-C [facility] [phone] 06/14/2015  8:25 AM

## 2015-06-14 NOTE — Patient Instructions (Addendum)
Keeping You Healthy  Get These Tests  Blood Pressure- Have your blood pressure checked by your healthcare provider at least once a year.  Normal blood pressure is 120/80.  Weight- Have your body mass index (BMI) calculated to screen for obesity.  BMI is a measure of body fat based on height and weight.  You can calculate your own BMI at [email or web address].com/bmi/  Cholesterol- Have your cholesterol checked every year.  Diabetes- Have your blood sugar checked every year if you have high blood pressure, high cholesterol, a family history of diabetes or if you are overweight.  Pap Test - Have a pap test every 1 to 5 years if you have been sexually active.  If you are older than 65 and recent pap tests have been normal you may not need additional pap tests.  In addition, if you have had a hysterectomy  for benign disease additional pap tests are not necessary.  Mammogram-Yearly mammograms are essential for early detection of breast cancer  Screening for Colon Cancer- Colonoscopy starting at age 50. Screening may begin sooner depending on your family history and other health conditions.  Follow up colonoscopy as directed by your Gastroenterologist.  Screening for Osteoporosis- Screening begins at age 65 with bone density scanning, sooner if you are at higher risk for developing Osteoporosis.  Get these medicines  Calcium with Vitamin D- Your body requires 1200-1500 mg of Calcium a day and 800-1000 IU of Vitamin D a day.  You can only absorb 500 mg of Calcium at a time therefore Calcium must be taken in 2 or 3 separate doses throughout the day.  Hormones- Hormone therapy has been associated with increased risk for certain cancers and heart disease.  Talk to your healthcare provider about if you need relief from menopausal symptoms.  Aspirin- Ask your healthcare provider about taking Aspirin to prevent Heart Disease and Stroke.  Get these Immuniztions  Flu shot- Every fall  Pneumonia shot-  Once after the age of 65; if you are younger ask your healthcare provider if you need a pneumonia shot.  Tetanus- Every ten years.  Zostavax- Once after the age of 60 to prevent shingles.  Take these steps  Don't smoke- Your healthcare provider can help you quit. For tips on how to quit, ask your healthcare provider or go to www.smokefree.gov or call 1-800 QUIT-NOW.  Be physically active- Exercise 5 days a week for a minimum of 30 minutes.  If you are not already physically active, start slow and gradually work up to 30 minutes of moderate physical activity.  Try walking, dancing, bike riding, swimming, etc.  Eat a healthy diet- Eat a variety of healthy foods such as fruits, vegetables, whole grains, low fat milk, low fat cheeses, yogurt, lean meats, chicken, fish, eggs, dried beans, tofu, etc.  For more information go to www.thenutritionsource.org  Dental visit- Brush and floss teeth twice daily; visit your dentist twice a year.  Eye exam- Visit your Optometrist or Ophthalmologist yearly.  Drink alcohol in moderation- Limit alcohol intake to one drink or less a day.  Never drink and drive.  Depression- Your emotional health is as important as your physical health.  If you're feeling down or losing interest in things you normally enjoy, please talk to your healthcare provider.  Seat Belts- can save your life; always wear one  Smoke/Carbon Monoxide detectors- These detectors need to be installed on the appropriate level of your home.  Replace batteries at least once a year.  Violence- If   anyone is threatening or hurting you, please tell your healthcare provider.  Living Will/ Health care power of attorney- Discuss with your healthcare provider and family.    Hypertension Hypertension, commonly called high blood pressure, is when the force of blood pumping through your arteries is too strong. Your arteries are the blood vessels that carry blood from your heart throughout your body. A  blood pressure reading consists of a higher number over a lower number, such as 110/72. The higher number (systolic) is the pressure inside your arteries when your heart pumps. The lower number (diastolic) is the pressure inside your arteries when your heart relaxes. Ideally you want your blood pressure below 120/80. Hypertension forces your heart to work harder to pump blood. Your arteries may become narrow or stiff. Having untreated or uncontrolled hypertension can cause heart attack, stroke, kidney disease, and other problems. RISK FACTORS Some risk factors for high blood pressure are controllable. Others are not.  Risk factors you cannot control include:   Race. You may be at higher risk if you are African American.  Age. Risk increases with age.  Gender. Men are at higher risk than women before age 66 years. After age 66, women are at higher risk than men. Risk factors you can control include:  Not getting enough exercise or physical activity.  Being overweight.  Getting too much fat, sugar, calories, or salt in your diet.  Drinking too much alcohol. SIGNS AND SYMPTOMS Hypertension does not usually cause signs or symptoms. Extremely high blood pressure (hypertensive crisis) may cause headache, anxiety, shortness of breath, and nosebleed. DIAGNOSIS To check if you have hypertension, your health care provider will measure your blood pressure while you are seated, with your arm held at the level of your heart. It should be measured at least twice using the same arm. Certain conditions can cause a difference in blood pressure between your right and left arms. A blood pressure reading that is higher than normal on one occasion does not mean that you need treatment. If it is not clear whether you have high blood pressure, you may be asked to return on a different day to have your blood pressure checked again. Or, you may be asked to monitor your blood pressure at home for 1 or more  weeks. TREATMENT Treating high blood pressure includes making lifestyle changes and possibly taking medicine. Living a healthy lifestyle can help lower high blood pressure. You may need to change some of your habits. Lifestyle changes may include:  Following the DASH diet. This diet is high in fruits, vegetables, and whole grains. It is low in salt, red meat, and added sugars.  Keep your sodium intake below 2,300 mg per day.  Getting at least 30-45 minutes of aerobic exercise at least 4 times per week.  Losing weight if necessary.  Not smoking.  Limiting alcoholic beverages.  Learning ways to reduce stress. Your health care provider may prescribe medicine if lifestyle changes are not enough to get your blood pressure under control, and if one of the following is true:  You are 5318-66 years of age and your systolic blood pressure is above 140.  You are 66 years of age or older, and your systolic blood pressure is above 150.  Your diastolic blood pressure is above 90.  You have diabetes, and your systolic blood pressure is over 140 or your diastolic blood pressure is over 90.  You have kidney disease and your blood pressure is above 140/90.  You  have heart disease and your blood pressure is above 140/90. Your personal target blood pressure may vary depending on your medical conditions, your age, and other factors. HOME CARE INSTRUCTIONS  Have your blood pressure rechecked as directed by your health care provider.   Take medicines only as directed by your health care provider. Follow the directions carefully. Blood pressure medicines must be taken as prescribed. The medicine does not work as well when you skip doses. Skipping doses also puts you at risk for problems.  Do not smoke.   Monitor your blood pressure at home as directed by your health care provider. SEEK MEDICAL CARE IF:   You think you are having a reaction to medicines taken.  You have recurrent headaches or  feel dizzy.  You have swelling in your ankles.  You have trouble with your vision. SEEK IMMEDIATE MEDICAL CARE IF:  You develop a severe headache or confusion.  You have unusual weakness, numbness, or feel faint.  You have severe chest or abdominal pain.  You vomit repeatedly.  You have trouble breathing. MAKE SURE YOU:   Understand these instructions.  Will watch your condition.  Will get help right away if you are not doing well or get worse.   This information is not intended to replace advice given to you by your health care provider. Make sure you discuss any questions you have with your health care provider.   Document Released: 12/31/2004 Document Revised: 05/17/2014 Document Reviewed: 10/23/2012 Elsevier Interactive Patient Education 2016 [employer].     IF you received an x-ray today, you will receive an invoice from [facility]. Please contact [facility] at [phone] with questions or concerns regarding your invoice.   IF you received labwork today, you will receive an invoice from [employer]. Please contact [facility] at [phone] with questions or concerns regarding your invoice.   Our billing staff will not be able to assist you with questions regarding bills from these companies.  You will be contacted with the lab results as soon as they are available. The fastest way to get your results is to activate your My Chart account. Instructions are located on the last page of this paperwork. If you have not heard from [place] regarding the results in 2 weeks, please contact this office.     We recommend that you schedule a mammogram for breast cancer screening. Typically, you do not need a referral to do this. Please contact a local imaging center to schedule your mammogram.  [facility] - [phone]  *ask for the Radiology Department [facility]) - [phone] or [phone]  [facility] - [phone] [facility] - [phone] [facility] - [phone]  *ask for the Radiology Department [facility] - [phone]  *ask for the Radiology Department [facility] - [phone]  *ask for the Mammography Department [facility] - [phone]

## 2015-07-10 ENCOUNTER — Telehealth: Payer: Self-pay

## 2015-07-10 ENCOUNTER — Telehealth: Payer: Self-pay | Admitting: Emergency Medicine

## 2015-07-10 NOTE — Telephone Encounter (Signed)
Left message for pt to call back regarding lab results questions

## 2015-07-10 NOTE — Telephone Encounter (Signed)
Pt is needing to talk with someone about lab results and what labs was done supposed to be about patient having cold spots on her body  Best number [phone]

## 2015-10-23 ENCOUNTER — Other Ambulatory Visit: Payer: Self-pay | Admitting: Obstetrics and Gynecology

## 2015-10-23 DIAGNOSIS — Z1231 Encounter for screening mammogram for malignant neoplasm of breast: Secondary | ICD-10-CM

## 2015-11-10 ENCOUNTER — Ambulatory Visit
Admission: RE | Admit: 2015-11-10 | Discharge: 2015-11-10 | Disposition: A | Discharge status: Home / Self Care | Payer: Medicare Other | Source: Ambulatory Visit | Attending: Obstetrics and Gynecology | Admitting: Obstetrics and Gynecology

## 2015-11-10 DIAGNOSIS — Z1231 Encounter for screening mammogram for malignant neoplasm of breast: Secondary | ICD-10-CM | POA: Diagnosis not present

## 2016-03-13 DIAGNOSIS — Z1279 Encounter for screening for malignant neoplasm of other genitourinary organs: Secondary | ICD-10-CM | POA: Diagnosis not present

## 2016-03-13 DIAGNOSIS — R8290 Unspecified abnormal findings in urine: Secondary | ICD-10-CM | POA: Diagnosis not present

## 2016-06-25 ENCOUNTER — Ambulatory Visit (INDEPENDENT_AMBULATORY_CARE_PROVIDER_SITE_OTHER): Payer: Medicare Other | Admitting: Physician Assistant

## 2016-06-25 ENCOUNTER — Encounter: Payer: Self-pay | Admitting: Physician Assistant

## 2016-06-25 VITALS — BP 136/79 | HR 81 | Temp 98.4°F | Resp 18 | Ht 68.5 in | Wt 177.6 lb

## 2016-06-25 DIAGNOSIS — I10 Essential (primary) hypertension: Secondary | ICD-10-CM | POA: Diagnosis not present

## 2016-06-25 DIAGNOSIS — Z13820 Encounter for screening for osteoporosis: Secondary | ICD-10-CM | POA: Diagnosis not present

## 2016-06-25 MED ORDER — HYDROCHLOROTHIAZIDE 12.5 MG PO CAPS: 12.5000 mg | ORAL_CAPSULE | Freq: Every day | ORAL | 3 refills | Status: DC

## 2016-06-25 NOTE — Patient Instructions (Addendum)
We recommend that you schedule a mammogram for breast cancer screening. Typically, you do not need a referral to do this. Please contact a local imaging center to schedule your mammogram.  [facility] - [phone]  *ask for the Radiology Department [facility]) - [phone] or [phone]  [facility] - [phone] [facility] - [phone] [facility] - [phone]  *ask for the Radiology Department [facility] - [phone]  *ask for the Radiology Department [facility] - [phone]  *ask for the Mammography Department [facility] - [phone]    IF you received an x-ray today, you will receive an invoice from [facility]. Please contact  Radiology at [phone] with questions or concerns regarding your invoice.   IF you received labwork today, you will receive an invoice from [facility]. Please contact [facility] at [phone] with questions or concerns regarding your invoice.   Our billing staff will not be able to assist you with questions regarding bills from these companies.  You will be contacted with the lab results as soon as they are available. The fastest way to get your results is to activate your My Chart account. Instructions are located on the last page of this paperwork. If you have not heard from us regarding the results in 2 weeks, please contact this office.     DASH Eating Plan DASH stands for "Dietary Approaches to Stop Hypertension." The DASH eating plan is a healthy eating plan that has been shown to reduce high blood pressure (hypertension). It may also reduce your risk for type 2 diabetes, heart disease, and stroke. The DASH eating plan may also help with weight loss. What are tips for following this plan? General guidelines  Avoid eating more than 2,300 mg (milligrams) of salt (sodium) a day. If you have  hypertension, you may need to reduce your sodium intake to 1,500 mg a day.  Limit alcohol intake to no more than 1 drink a day for nonpregnant women and 2 drinks a day for men. One drink equals 12 oz of beer, 5 oz of wine, or 1 oz of hard liquor.  Work with your health care provider to maintain a healthy body weight or to lose weight. Ask what an ideal weight is for you.  Get at least 30 minutes of exercise that causes your heart to beat faster (aerobic exercise) most days of the week. Activities may include walking, swimming, or biking.  Work with your health care provider or diet and nutrition specialist (dietitian) to adjust your eating plan to your individual calorie needs. Reading food labels  Check food labels for the amount of sodium per serving. Choose foods with less than 5 percent of the Daily Value of sodium. Generally, foods with less than 300 mg of sodium per serving fit into this eating plan.  To find whole grains, look for the word "whole" as the first word in the ingredient list. Shopping  Buy products labeled as "low-sodium" or "no salt added."  Buy fresh foods. Avoid canned foods and premade or frozen meals. Cooking  Avoid adding salt when cooking. Use salt-free seasonings or herbs instead of table salt or sea salt. Check with your health care provider or pharmacist before using salt substitutes.  Do not fry foods. Cook foods using healthy methods such as baking, boiling, grilling, and broiling instead.  Cook with heart-healthy oils, such as olive, canola, soybean, or sunflower oil. Meal   planning   Eat a balanced diet that includes: ? 5 or more servings of fruits and vegetables each day. At each meal, try to fill half of your plate with fruits and vegetables. ? Up to 6-8 servings of whole grains each day. ? Less than 6 oz of lean meat, poultry, or fish each day. A 3-oz serving of meat is about the same size as a deck of cards. One egg equals 1 oz. ? 2 servings of  low-fat dairy each day. ? A serving of nuts, seeds, or beans 5 times each week. ? Heart-healthy fats. Healthy fats called Omega-3 fatty acids are found in foods such as flaxseeds and coldwater fish, like sardines, salmon, and mackerel.  Limit how much you eat of the following: ? Canned or prepackaged foods. ? Food that is high in trans fat, such as fried foods. ? Food that is high in saturated fat, such as fatty meat. ? Sweets, desserts, sugary drinks, and other foods with added sugar. ? Full-fat dairy products.  Do not salt foods before eating.  Try to eat at least 2 vegetarian meals each week.  Eat more home-cooked food and less restaurant, buffet, and fast food.  When eating at a restaurant, ask that your food be prepared with less salt or no salt, if possible. What foods are recommended? The items listed may not be a complete list. Talk with your dietitian about what dietary choices are best for you. Grains Whole-grain or whole-wheat bread. Whole-grain or whole-wheat pasta. Brown rice. Oatmeal. Quinoa. Bulgur. Whole-grain and low-sodium cereals. Pita bread. Low-fat, low-sodium crackers. Whole-wheat flour tortillas. Vegetables Fresh or frozen vegetables (raw, steamed, roasted, or grilled). Low-sodium or reduced-sodium tomato and vegetable juice. Low-sodium or reduced-sodium tomato sauce and tomato paste. Low-sodium or reduced-sodium canned vegetables. Fruits All fresh, dried, or frozen fruit. Canned fruit in natural juice (without added sugar). Meat and other protein foods Skinless chicken or turkey. Ground chicken or turkey. Pork with fat trimmed off. Fish and seafood. Egg whites. Dried beans, peas, or lentils. Unsalted nuts, nut butters, and seeds. Unsalted canned beans. Lean cuts of beef with fat trimmed off. Low-sodium, lean deli meat. Dairy Low-fat (1%) or fat-free (skim) milk. Fat-free, low-fat, or reduced-fat cheeses. Nonfat, low-sodium ricotta or cottage cheese. Low-fat or  nonfat yogurt. Low-fat, low-sodium cheese. Fats and oils Soft margarine without trans fats. Vegetable oil. Low-fat, reduced-fat, or light mayonnaise and salad dressings (reduced-sodium). Canola, safflower, olive, soybean, and sunflower oils. Avocado. Seasoning and other foods Herbs. Spices. Seasoning mixes without salt. Unsalted popcorn and pretzels. Fat-free sweets. What foods are not recommended? The items listed may not be a complete list. Talk with your dietitian about what dietary choices are best for you. Grains Baked goods made with fat, such as croissants, muffins, or some breads. Dry pasta or rice meal packs. Vegetables Creamed or fried vegetables. Vegetables in a cheese sauce. Regular canned vegetables (not low-sodium or reduced-sodium). Regular canned tomato sauce and paste (not low-sodium or reduced-sodium). Regular tomato and vegetable juice (not low-sodium or reduced-sodium). Pickles. Olives. Fruits Canned fruit in a light or heavy syrup. Fried fruit. Fruit in cream or butter sauce. Meat and other protein foods Fatty cuts of meat. Ribs. Fried meat. Bacon. Sausage. Bologna and other processed lunch meats. Salami. Fatback. Hotdogs. Bratwurst. Salted nuts and seeds. Canned beans with added salt. Canned or smoked fish. Whole eggs or egg yolks. Chicken or turkey with skin. Dairy Whole or 2% milk, cream, and half-and-half. Whole or full-fat cream cheese. Whole-fat   or sweetened yogurt. Full-fat cheese. Nondairy creamers. Whipped toppings. Processed cheese and cheese spreads. Fats and oils Butter. Stick margarine. Lard. Shortening. Ghee. Bacon fat. Tropical oils, such as coconut, palm kernel, or palm oil. Seasoning and other foods Salted popcorn and pretzels. Onion salt, garlic salt, seasoned salt, table salt, and sea salt. Worcestershire sauce. Tartar sauce. Barbecue sauce. Teriyaki sauce. Soy sauce, including reduced-sodium. Steak sauce. Canned and packaged gravies. Fish sauce. Oyster  sauce. Cocktail sauce. Horseradish that you find on the shelf. Ketchup. Mustard. Meat flavorings and tenderizers. Bouillon cubes. Hot sauce and Tabasco sauce. Premade or packaged marinades. Premade or packaged taco seasonings. Relishes. Regular salad dressings. Where to find more information:  National Heart, Lung, and Blood Institute: www.nhlbi.nih.gov  American Heart Association: www.heart.org Summary  The DASH eating plan is a healthy eating plan that has been shown to reduce high blood pressure (hypertension). It may also reduce your risk for type 2 diabetes, heart disease, and stroke.  With the DASH eating plan, you should limit salt (sodium) intake to 2,300 mg a day. If you have hypertension, you may need to reduce your sodium intake to 1,500 mg a day.  When on the DASH eating plan, aim to eat more fresh fruits and vegetables, whole grains, lean proteins, low-fat dairy, and heart-healthy fats.  Work with your health care provider or diet and nutrition specialist (dietitian) to adjust your eating plan to your individual calorie needs. This information is not intended to replace advice given to you by your health care provider. Make sure you discuss any questions you have with your health care provider. Document Released: 12/20/2010 Document Revised: 12/25/2015 Document Reviewed: 12/25/2015 Elsevier Interactive Patient Education  2017 Elsevier Inc.  

## 2016-06-25 NOTE — Progress Notes (Signed)
Subjective:    Carla Welch is a 66 y.o. female who presents for Medicare Annual/Subsequent preventive examination.  Preventive Screening-Counseling & Management  Tobacco History  Smoking Status  . Never Smoker  Smokeless Tobacco  . Never Used    Current Problems (verified) Patient Active Problem List   Diagnosis Date Noted  . Essential hypertension 06/08/2014  . Hyperlipidemia 06/08/2014    Medications Prior to Visit Current Outpatient Prescriptions on File Prior to Visit  Medication Sig Dispense Refill  . hydrochlorothiazide (MICROZIDE) 12.5 MG capsule Take 1 capsule (12.5 mg total) by mouth daily. 90 capsule 3  . fluticasone (FLONASE) 50 MCG/ACT nasal spray Place 2 sprays into the nose daily. 16 g 11   No current facility-administered medications on file prior to visit.     Current Medications (verified) Current Outpatient Prescriptions  Medication Sig Dispense Refill  . hydrochlorothiazide (MICROZIDE) 12.5 MG capsule Take 1 capsule (12.5 mg total) by mouth daily. 90 capsule 3  . fluticasone (FLONASE) 50 MCG/ACT nasal spray Place 2 sprays into the nose daily. 16 g 11   No current facility-administered medications for this visit.      Allergies (verified) Morphine and related and Hydrochlorothiazide   PAST HISTORY  Family History Family History  Problem Relation Age of Onset  . Cancer Mother        lung  . Hypertension Maternal Grandmother     Social History Social History  Substance Use Topics  . Smoking status: Never Smoker  . Smokeless tobacco: Never Used  . Alcohol use No     Are there smokers in your home (other than you)? No  Risk Factors Current exercise habits: The patient does not participate in regular exercise at present.  Dietary issues discussed: no dietary issues   Cardiac risk factors: hypertension.  Hearing Difficulties: No Do you often ask people to speak up or repeat themselves? No Do you experience ringing or noises in your  ears? No Do you have difficulty understanding soft or whispered voices? No   Do you feel that you have a problem with memory? No  Do you often misplace items? No  Do you feel safe at home?  Yes  Cognitive Testing  Alert? Yes  Normal Appearance?Yes  Oriented to person? Yes  Place? Yes   Time? Yes   Recall of three objects?  Yes  Can perform simple calculations? Yes  Displays appropriate judgment?Yes  Can read the correct time from a watch face?Yes   Advanced Directives have been discussed with the patient? Yes, patient will review AD with husband.  She was given packet with information. Functional Status Survey: Is the patient deaf or have difficulty hearing?: No Does the patient have difficulty seeing, even when wearing glasses/contacts?: No Does the patient have difficulty concentrating, remembering, or making decisions?: No Does the patient have difficulty walking or climbing stairs?: No Does the patient have difficulty dressing or bathing?: No Does the patient have difficulty doing errands alone such as visiting a doctor's office or shopping?: No Depression screen [facility] 2/9 06/25/2016 06/14/2015 06/08/2014  Decreased Interest 0 0 0  Down, Depressed, Hopeless 0 0 0  PHQ - 2 Score 0 0 0    Visual Acuity Screening   Right eye Left eye Both eyes  Without correction:     With correction: 20/100 20/40 20/40     List the Names of Other Physician/Practitioners you currently use: 1.  N/a  Indicate any recent Medical Services you may have received from  other than [facility] providers in the past year (date may be approximate).  Immunization History  Administered Date(s) Administered  . Pneumococcal Conjugate-13 06/14/2015  . Tdap 06/08/2014    Screening Tests Health Maintenance  Topic Date Due  . Hepatitis C Screening  10/31/1950  . COLONOSCOPY  11/20/1999  . DEXA SCAN  11/20/2014  . PNA vac Low Risk Adult (2 of 2 - PPSV23) 06/13/2016  . INFLUENZA VACCINE  08/14/2016  . MAMMOGRAM   11/09/2017  . TETANUS/TDAP  06/07/2024    All answers were reviewed with the patient and necessary referrals were made:  Ivar Drape, PA   06/25/2016   Diet review for nutrition referral? Yes ____  Not Indicated ____   Patient Instructions (the written plan) was given to the patient.  Medicare Attestation I have personally reviewed: The patient's medical and social history Their use of alcohol, tobacco or illicit drugs Their current medications and supplements The patient's functional ability including ADLs,fall risks, home safety risks, cognitive, and hearing and visual impairment Diet and physical activities Evidence for depression or mood disorders  The patient's weight, height, BMI, and visual acuity have been recorded in the chart.  I have made referrals, counseling, and provided education to the patient based on review of the above and I have provided the patient with a written personalized care plan for preventive services.     Colletta Maryland Erm[place], [place]   06/25/2016    Assessment  bp refilled, she should return in 6 months for recheck, unless labs are concerning for sooner visit. Essential hypertension - Plan: hydrochlorothiazide (MICROZIDE) 12.5 MG capsule, CMP14+EGFR, Lipid panel  Screening for osteoporosis - Plan: [facility] DEXA SCAN  Ivar Drape, PA-C [facility] 6/15/20185:23 PM  Ivar Drape, PA-C [facility] 6/15/20185:22 PM

## 2016-06-26 LAB — LIPID PANEL
Chol/HDL Ratio: 4 ratio (ref 0.0–4.4)
Cholesterol, Total: 206 mg/dL — ABNORMAL HIGH (ref 100–199)
HDL: 52 mg/dL (ref >39)
LDL Calculated: 122 mg/dL — ABNORMAL HIGH (ref 0–99)
Triglycerides: 162 mg/dL — ABNORMAL HIGH (ref 0–149)
VLDL CHOLESTEROL CAL: 32 mg/dL (ref 5–40)

## 2016-06-26 LAB — CMP14+EGFR
ALBUMIN: 4.5 g/dL (ref 3.6–4.8)
ALT: 11 IU/L (ref 0–32)
AST: 14 IU/L (ref 0–40)
Albumin/Globulin Ratio: 1.7 (ref 1.2–2.2)
Alkaline Phosphatase: 71 IU/L (ref 39–117)
BUN / CREAT RATIO: 20 (ref 12–28)
BUN: 16 mg/dL (ref 8–27)
Bilirubin Total: 0.4 mg/dL (ref 0.0–1.2)
CHLORIDE: 101 mmol/L (ref 96–106)
CO2: 23 mmol/L (ref 20–29)
Calcium: 9.7 mg/dL (ref 8.7–10.3)
Creatinine, Ser: 0.8 mg/dL (ref 0.57–1.00)
GFR calc Af Amer: 89 mL/min/{1.73_m2} (ref >59)
GFR calc non Af Amer: 77 mL/min/{1.73_m2} (ref >59)
GLUCOSE: 104 mg/dL — AB (ref 65–99)
Globulin, Total: 2.7 g/dL (ref 1.5–4.5)
Potassium: 3.9 mmol/L (ref 3.5–5.2)
SODIUM: 140 mmol/L (ref 134–144)
Total Protein: 7.2 g/dL (ref 6.0–8.5)

## 2017-02-26 ENCOUNTER — Other Ambulatory Visit: Payer: Self-pay | Admitting: Obstetrics and Gynecology

## 2017-02-26 DIAGNOSIS — Z1231 Encounter for screening mammogram for malignant neoplasm of breast: Secondary | ICD-10-CM

## 2017-03-28 ENCOUNTER — Ambulatory Visit
Admission: RE | Admit: 2017-03-28 | Discharge: 2017-03-28 | Disposition: A | Discharge status: Home / Self Care | Payer: Medicare Other | Source: Ambulatory Visit | Attending: Obstetrics and Gynecology | Admitting: Obstetrics and Gynecology

## 2017-03-28 DIAGNOSIS — Z1231 Encounter for screening mammogram for malignant neoplasm of breast: Secondary | ICD-10-CM | POA: Diagnosis not present

## 2017-04-15 ENCOUNTER — Encounter: Payer: Self-pay | Admitting: Physician Assistant

## 2017-07-10 ENCOUNTER — Encounter: Payer: Self-pay | Admitting: Urgent Care

## 2017-07-10 ENCOUNTER — Ambulatory Visit (INDEPENDENT_AMBULATORY_CARE_PROVIDER_SITE_OTHER): Payer: Medicare Other | Admitting: Urgent Care

## 2017-07-10 ENCOUNTER — Other Ambulatory Visit: Payer: Self-pay

## 2017-07-10 VITALS — BP 120/62 | HR 84 | Temp 97.9°F | Resp 16 | Ht 67.5 in | Wt 183.8 lb

## 2017-07-10 DIAGNOSIS — Z1159 Encounter for screening for other viral diseases: Secondary | ICD-10-CM

## 2017-07-10 DIAGNOSIS — Z0000 Encounter for general adult medical examination without abnormal findings: Secondary | ICD-10-CM

## 2017-07-10 DIAGNOSIS — Z23 Encounter for immunization: Secondary | ICD-10-CM

## 2017-07-10 DIAGNOSIS — R0981 Nasal congestion: Secondary | ICD-10-CM | POA: Diagnosis not present

## 2017-07-10 DIAGNOSIS — Z1329 Encounter for screening for other suspected endocrine disorder: Secondary | ICD-10-CM

## 2017-07-10 DIAGNOSIS — I10 Essential (primary) hypertension: Secondary | ICD-10-CM

## 2017-07-10 DIAGNOSIS — Z9109 Other allergy status, other than to drugs and biological substances: Secondary | ICD-10-CM

## 2017-07-10 DIAGNOSIS — Z130 Encounter for screening for diseases of the blood and blood-forming organs and certain disorders involving the immune mechanism: Secondary | ICD-10-CM | POA: Diagnosis not present

## 2017-07-10 DIAGNOSIS — Z1321 Encounter for screening for nutritional disorder: Secondary | ICD-10-CM | POA: Diagnosis not present

## 2017-07-10 DIAGNOSIS — Z13228 Encounter for screening for other metabolic disorders: Secondary | ICD-10-CM | POA: Diagnosis not present

## 2017-07-10 MED ORDER — HYDROCHLOROTHIAZIDE 12.5 MG PO CAPS: 12.5000 mg | ORAL_CAPSULE | Freq: Every day | ORAL | 3 refills | Status: DC

## 2017-07-10 MED ORDER — PSEUDOEPHEDRINE HCL 60 MG PO TABS: 60.0000 mg | ORAL_TABLET | Freq: Three times a day (TID) | ORAL | 0 refills | Status: DC | PRN

## 2017-07-10 MED ORDER — FLUTICASONE PROPIONATE 50 MCG/ACT NA SUSP: 2.0000 | Freq: Every day | NASAL | 5 refills | Status: DC

## 2017-07-10 MED ORDER — CETIRIZINE HCL 10 MG PO TABS: 10.0000 mg | ORAL_TABLET | Freq: Every day | ORAL | 11 refills | Status: DC

## 2017-07-10 NOTE — Patient Instructions (Addendum)
Health Maintenance, Female Adopting a healthy lifestyle and getting preventive care can go a long way to promote health and wellness. Talk with your health care provider about what schedule of regular examinations is right for you. This is a good chance for you to check in with your provider about disease prevention and staying healthy. In between checkups, there are plenty of things you can do on your own. Experts have done a lot of research about which lifestyle changes and preventive measures are most likely to keep you healthy. Ask your health care provider for more information. Weight and diet Eat a healthy diet  Be sure to include plenty of vegetables, fruits, low-fat dairy products, and lean protein.  Do not eat a lot of foods high in solid fats, added sugars, or salt.  Get regular exercise. This is one of the most important things you can do for your health. ? Most adults should exercise for at least 150 minutes each week. The exercise should increase your heart rate and make you sweat (moderate-intensity exercise). ? Most adults should also do strengthening exercises at least twice a week. This is in addition to the moderate-intensity exercise.  Maintain a healthy weight  Body mass index (BMI) is a measurement that can be used to identify possible weight problems. It estimates body fat based on height and weight. Your health care provider can help determine your BMI and help you achieve or maintain a healthy weight.  For females 20 years of age and older: ? A BMI below 18.5 is considered underweight. ? A BMI of 18.5 to 24.9 is normal. ? A BMI of 25 to 29.9 is considered overweight. ? A BMI of 30 and above is considered obese.  Watch levels of cholesterol and blood lipids  You should start having your blood tested for lipids and cholesterol at 68 years of age, then have this test every 5 years.  You may need to have your cholesterol levels checked more often if: ? Your lipid or  cholesterol levels are high. ? You are older than 68 years of age. ? You are at high risk for heart disease.  Cancer screening Lung Cancer  Lung cancer screening is recommended for adults 55-80 years old who are at high risk for lung cancer because of a history of smoking.  A yearly low-dose CT scan of the lungs is recommended for people who: ? Currently smoke. ? Have quit within the past 15 years. ? Have at least a 30-pack-year history of smoking. A pack year is smoking an average of one pack of cigarettes a day for 1 year.  Yearly screening should continue until it has been 15 years since you quit.  Yearly screening should stop if you develop a health problem that would prevent you from having lung cancer treatment.  Breast Cancer  Practice breast self-awareness. This means understanding how your breasts normally appear and feel.  It also means doing regular breast self-exams. Let your health care provider know about any changes, no matter how small.  If you are in your 20s or 30s, you should have a clinical breast exam (CBE) by a health care provider every 1-3 years as part of a regular health exam.  If you are 40 or older, have a CBE every year. Also consider having a breast X-ray (mammogram) every year.  If you have a family history of breast cancer, talk to your health care provider about genetic screening.  If you are at high risk   for breast cancer, talk to your health care provider about having an MRI and a mammogram every year.  Breast cancer gene (BRCA) assessment is recommended for women who have family members with BRCA-related cancers. BRCA-related cancers include: ? Breast. ? Ovarian. ? Tubal. ? Peritoneal cancers.  Results of the assessment will determine the need for genetic counseling and BRCA1 and BRCA2 testing.  Cervical Cancer Your health care provider may recommend that you be screened regularly for cancer of the pelvic organs (ovaries, uterus, and  vagina). This screening involves a pelvic examination, including checking for microscopic changes to the surface of your cervix (Pap test). You may be encouraged to have this screening done every 3 years, beginning at age 22.  For women ages 56-65, health care providers may recommend pelvic exams and Pap testing every 3 years, or they may recommend the Pap and pelvic exam, combined with testing for human papilloma virus (HPV), every 5 years. Some types of HPV increase your risk of cervical cancer. Testing for HPV may also be done on women of any age with unclear Pap test results.  Other health care providers may not recommend any screening for nonpregnant women who are considered low risk for pelvic cancer and who do not have symptoms. Ask your health care provider if a screening pelvic exam is right for you.  If you have had past treatment for cervical cancer or a condition that could lead to cancer, you need Pap tests and screening for cancer for at least 20 years after your treatment. If Pap tests have been discontinued, your risk factors (such as having a new sexual partner) need to be reassessed to determine if screening should resume. Some women have medical problems that increase the chance of getting cervical cancer. In these cases, your health care provider may recommend more frequent screening and Pap tests.  Colorectal Cancer  This type of cancer can be detected and often prevented.  Routine colorectal cancer screening usually begins at 68 years of age and continues through 69 years of age.  Your health care provider may recommend screening at an earlier age if you have risk factors for colon cancer.  Your health care provider may also recommend using home test kits to check for hidden blood in the stool.  A small camera at the end of a tube can be used to examine your colon directly (sigmoidoscopy or colonoscopy). This is done to check for the earliest forms of colorectal  cancer.  Routine screening usually begins at age 33.  Direct examination of the colon should be repeated every 5-10 years through 68 years of age. However, you may need to be screened more often if early forms of precancerous polyps or small growths are found.  Skin Cancer  Check your skin from head to toe regularly.  Tell your health care provider about any new moles or changes in moles, especially if there is a change in a mole's shape or color.  Also tell your health care provider if you have a mole that is larger than the size of a pencil eraser.  Always use sunscreen. Apply sunscreen liberally and repeatedly throughout the day.  Protect yourself by wearing long sleeves, pants, a wide-brimmed hat, and sunglasses whenever you are outside.  Heart disease, diabetes, and high blood pressure  High blood pressure causes heart disease and increases the risk of stroke. High blood pressure is more likely to develop in: ? People who have blood pressure in the high end of  the normal range (130-139/85-89 mm Hg). ? People who are overweight or obese. ? People who are African American.  If you are 21-29 years of age, have your blood pressure checked every 3-5 years. If you are 3 years of age or older, have your blood pressure checked every year. You should have your blood pressure measured twice-once when you are at a hospital or clinic, and once when you are not at a hospital or clinic. Record the average of the two measurements. To check your blood pressure when you are not at a hospital or clinic, you can use: ? An automated blood pressure machine at a pharmacy. ? A home blood pressure monitor.  If you are between 17 years and 37 years old, ask your health care provider if you should take aspirin to prevent strokes.  Have regular diabetes screenings. This involves taking a blood sample to check your fasting blood sugar level. ? If you are at a normal weight and have a low risk for diabetes,  have this test once every three years after 68 years of age. ? If you are overweight and have a high risk for diabetes, consider being tested at a younger age or more often. Preventing infection Hepatitis B  If you have a higher risk for hepatitis B, you should be screened for this virus. You are considered at high risk for hepatitis B if: ? You were born in a country where hepatitis B is common. Ask your health care provider which countries are considered high risk. ? Your parents were born in a high-risk country, and you have not been immunized against hepatitis B (hepatitis B vaccine). ? You have HIV or AIDS. ? You use needles to inject street drugs. ? You live with someone who has hepatitis B. ? You have had sex with someone who has hepatitis B. ? You get hemodialysis treatment. ? You take certain medicines for conditions, including cancer, organ transplantation, and autoimmune conditions.  Hepatitis C  Blood testing is recommended for: ? Everyone born from [birth year] through 1966. ? Anyone with known risk factors for hepatitis C.  Sexually transmitted infections (STIs)  You should be screened for sexually transmitted infections (STIs) including gonorrhea and chlamydia if: ? You are sexually active and are younger than 68 years of age. ? You are older than 68 years of age and your health care provider tells you that you are at risk for this type of infection. ? Your sexual activity has changed since you were last screened and you are at an increased risk for chlamydia or gonorrhea. Ask your health care provider if you are at risk.  If you do not have HIV, but are at risk, it may be recommended that you take a prescription medicine daily to prevent HIV infection. This is called pre-exposure prophylaxis (PrEP). You are considered at risk if: ? You are sexually active and do not regularly use condoms or know the HIV status of your partner(s). ? You take drugs by injection. ? You are  sexually active with a partner who has HIV.  Talk with your health care provider about whether you are at high risk of being infected with HIV. If you choose to begin PrEP, you should first be tested for HIV. You should then be tested every 3 months for as long as you are taking PrEP. Pregnancy  If you are premenopausal and you may become pregnant, ask your health care provider about preconception counseling.  If you may become  pregnant, take 400 to 800 micrograms (mcg) of folic acid every day.  If you want to prevent pregnancy, talk to your health care provider about birth control (contraception). Osteoporosis and menopause  Osteoporosis is a disease in which the bones lose minerals and strength with aging. This can result in serious bone fractures. Your risk for osteoporosis can be identified using a bone density scan.  If you are 65 years of age or older, or if you are at risk for osteoporosis and fractures, ask your health care provider if you should be screened.  Ask your health care provider whether you should take a calcium or vitamin D supplement to lower your risk for osteoporosis.  Menopause may have certain physical symptoms and risks.  Hormone replacement therapy may reduce some of these symptoms and risks. Talk to your health care provider about whether hormone replacement therapy is right for you. Follow these instructions at home:  Schedule regular health, dental, and eye exams.  Stay current with your immunizations.  Do not use any tobacco products including cigarettes, chewing tobacco, or electronic cigarettes.  If you are pregnant, do not drink alcohol.  If you are breastfeeding, limit how much and how often you drink alcohol.  Limit alcohol intake to no more than 1 drink per day for nonpregnant women. One drink equals 12 ounces of beer, 5 ounces of wine, or 1 ounces of hard liquor.  Do not use street drugs.  Do not share needles.  Ask your health care  provider for help if you need support or information about quitting drugs.  Tell your health care provider if you often feel depressed.  Tell your health care provider if you have ever been abused or do not feel safe at home. This information is not intended to replace advice given to you by your health care provider. Make sure you discuss any questions you have with your health care provider. Document Released: 07/16/2010 Document Revised: 06/08/2015 Document Reviewed: 10/04/2014 Elsevier Interactive Patient Education  2018 Elsevier Inc.     IF you received an x-ray today, you will receive an invoice from [facility]. Please contact [facility] at [phone] with questions or concerns regarding your invoice.   IF you received labwork today, you will receive an invoice from [facility]. Please contact [facility] at [phone] with questions or concerns regarding your invoice.   Our billing staff will not be able to assist you with questions regarding bills from these companies.  You will be contacted with the lab results as soon as they are available. The fastest way to get your results is to activate your My Chart account. Instructions are located on the last page of this paperwork. If you have not heard from us regarding the results in 2 weeks, please contact this office.      

## 2017-07-10 NOTE — Progress Notes (Addendum)
MRN: [id number]  Subjective:   [name] is a 67 y.o. female presenting for annual physical exam.  Patient is happily married, works as an Print production planner. Has good relationships at home, has a good support network. Denies smoking cigarettes or drinking alcohol.  Declines STI testing.  Medical care team includes: PCP: Patient, No Pcp Per Vision: Has monovision, wears contacts. Had last eye exam this year.  Dental: Cleanings once a year. OB/GYN: Last pap smear 2015 and is seen at [facility], is on 5 year pap smears, had last exam with them last year; mammography 03/2017 and was normal Specialists: None.  Health Maintenance: Last colonoscopy was 2011, was normal and is on 10 year follow up.  Needs her Pneumovax 23 updated today.  Will screen for low risk hep C.   Carla Welch has Essential hypertension and Hyperlipidemia on their problem list.  Carla Welch has a current medication list which includes the following prescription(s): hydrochlorothiazide and fluticasone. She is allergic to morphine and related and hydrochlorothiazide. Carla Welch  has a past medical history of Hyperlipidemia, Hypertension, and IBS (irritable bowel syndrome). Also  has a past surgical history that includes Cosmetic surgery and Abdominal hysterectomy. Her family history includes Cancer in her mother; Hypertension in her maternal grandmother.  Immunizations: Needs pneumo 23.  Review of Systems  Constitutional: Negative for chills, diaphoresis, fever, malaise/fatigue and weight loss.  HENT: Positive for congestion (needs refill of Flonase) and tinnitus. Negative for ear discharge, ear pain, hearing loss, nosebleeds and sore throat.   Eyes: Negative for blurred vision, double vision, photophobia, pain, discharge and redness.  Respiratory: Negative for cough, shortness of breath and wheezing.   Cardiovascular: Negative for chest pain, palpitations and leg swelling.  Gastrointestinal: Negative for abdominal pain, blood  in stool, constipation, diarrhea, nausea and vomiting.  Genitourinary: Negative for dysuria, flank pain, frequency, hematuria and urgency.  Musculoskeletal: Negative for back pain, joint pain and myalgias.  Skin: Negative for itching and rash.  Neurological: Negative for dizziness, tingling, seizures, loss of consciousness, weakness and headaches.  Endo/Heme/Allergies: Negative for polydipsia.  Psychiatric/Behavioral: Negative for depression, hallucinations, memory loss, substance abuse and suicidal ideas. The patient is not nervous/anxious and does not have insomnia.     Objective:   Vitals: BP 120/62 (BP Location: Left Arm, Patient Position: Sitting, Cuff Size: Large)   Pulse 84   Temp 97.9 F (36.6 C) (Oral)   Resp 16   Ht 5' 7.5" (1.715 m)   Wt 183 lb 12.8 oz (83.4 kg)   SpO2 98%   BMI 28.36 kg/m    Visual Acuity Screening   Right eye Left eye Both eyes  Without correction:     With correction: 20/100 20/40 20/25    Physical Exam  Constitutional: She is oriented to person, place, and time. She appears well-developed and well-nourished.  HENT:  TM's intact bilaterally, no effusions or erythema. Nasal turbinates pink and moist, nasal passages patent. No sinus tenderness. Oropharynx clear, mucous membranes moist, dentition in good repair.  Eyes: Pupils are equal, round, and reactive to light. Conjunctivae and EOM are normal. Right eye exhibits no discharge. Left eye exhibits no discharge. No scleral icterus.  Neck: Normal range of motion. Neck supple. No thyromegaly present.  Cardiovascular: Normal rate, regular rhythm and intact distal pulses. Exam reveals no gallop and no friction rub.  No murmur heard. Pulmonary/Chest: No respiratory distress. She has no wheezes. She has no rales.  Abdominal: Soft. Bowel sounds are normal. She exhibits no distension  and no mass. There is no tenderness. There is no rebound and no guarding.  Musculoskeletal: Normal range of motion. She  exhibits no edema or tenderness.  Lymphadenopathy:    She has no cervical adenopathy.  Neurological: She is alert and oriented to person, place, and time. She has normal reflexes. She displays normal reflexes. Coordination normal.  Skin: Skin is warm and dry. No rash noted. No erythema. No pallor.  Psychiatric: She has a normal mood and affect.   Assessment and Plan :   Patient is medically stable, very pleasant.  Labs pending.  Refills provided for Flonase, patient is to start oral antihistamine as well and can use pseudoephedrine as needed for ETD and/or nasal congestion.  Patient needs a refill of her hydrochlorothiazide for her hypertension.  Reports that she has hives with the tablet form of hydrochlorothiazide but not the capsule.  Counseled patient on potential for adverse effects with medications prescribed today, patient verbalized understanding. Discussed healthy lifestyle, diet, exercise, preventative care, vaccinations, and addressed patient's concerns.     Wallis BambergMario Magdelyn Roebuck, PA-C [facility] [phone] 07/10/2017  9:42 AM

## 2017-07-10 NOTE — Addendum Note (Signed)
Addended by: Wallis BambergMANI, Lanie Schelling on: 07/10/2017 10:08 AM   Modules accepted: Orders

## 2017-07-11 LAB — COMPREHENSIVE METABOLIC PANEL
ALBUMIN: 4.4 g/dL (ref 3.6–4.8)
ALK PHOS: 74 IU/L (ref 39–117)
ALT: 11 IU/L (ref 0–32)
AST: 13 IU/L (ref 0–40)
Albumin/Globulin Ratio: 1.8 (ref 1.2–2.2)
BUN / CREAT RATIO: 29 — AB (ref 12–28)
BUN: 22 mg/dL (ref 8–27)
Bilirubin Total: 0.4 mg/dL (ref 0.0–1.2)
CO2: 23 mmol/L (ref 20–29)
CREATININE: 0.75 mg/dL (ref 0.57–1.00)
Calcium: 9.3 mg/dL (ref 8.7–10.3)
Chloride: 100 mmol/L (ref 96–106)
GFR calc non Af Amer: 83 mL/min/{1.73_m2} (ref >59)
GFR, EST AFRICAN AMERICAN: 95 mL/min/{1.73_m2} (ref >59)
GLUCOSE: 108 mg/dL — AB (ref 65–99)
Globulin, Total: 2.4 g/dL (ref 1.5–4.5)
Potassium: 4 mmol/L (ref 3.5–5.2)
Sodium: 137 mmol/L (ref 134–144)
TOTAL PROTEIN: 6.8 g/dL (ref 6.0–8.5)

## 2017-07-11 LAB — LIPID PANEL
CHOLESTEROL TOTAL: 210 mg/dL — AB (ref 100–199)
Chol/HDL Ratio: 4.5 ratio — ABNORMAL HIGH (ref 0.0–4.4)
HDL: 47 mg/dL (ref >39)
LDL CALC: 120 mg/dL — AB (ref 0–99)
Triglycerides: 216 mg/dL — ABNORMAL HIGH (ref 0–149)
VLDL CHOLESTEROL CAL: 43 mg/dL — AB (ref 5–40)

## 2017-07-11 LAB — TSH: TSH: 2.21 u[IU]/mL (ref 0.450–4.500)

## 2017-07-11 LAB — HEPATITIS C ANTIBODY: Hep C Virus Ab: <0.1 s/co ratio (ref 0.0–0.9)

## 2017-08-28 DIAGNOSIS — M25562 Pain in left knee: Secondary | ICD-10-CM | POA: Diagnosis not present

## 2017-08-28 DIAGNOSIS — M25561 Pain in right knee: Secondary | ICD-10-CM | POA: Diagnosis not present

## 2017-08-28 DIAGNOSIS — M1711 Unilateral primary osteoarthritis, right knee: Secondary | ICD-10-CM | POA: Diagnosis not present

## 2017-08-28 DIAGNOSIS — M1712 Unilateral primary osteoarthritis, left knee: Secondary | ICD-10-CM | POA: Diagnosis not present

## 2017-09-12 DIAGNOSIS — M1711 Unilateral primary osteoarthritis, right knee: Secondary | ICD-10-CM | POA: Diagnosis not present

## 2017-09-12 DIAGNOSIS — M1712 Unilateral primary osteoarthritis, left knee: Secondary | ICD-10-CM | POA: Diagnosis not present

## 2017-09-18 DIAGNOSIS — M1711 Unilateral primary osteoarthritis, right knee: Secondary | ICD-10-CM | POA: Diagnosis not present

## 2017-09-18 DIAGNOSIS — M1712 Unilateral primary osteoarthritis, left knee: Secondary | ICD-10-CM | POA: Diagnosis not present

## 2017-09-30 DIAGNOSIS — M1712 Unilateral primary osteoarthritis, left knee: Secondary | ICD-10-CM | POA: Diagnosis not present

## 2017-09-30 DIAGNOSIS — M1711 Unilateral primary osteoarthritis, right knee: Secondary | ICD-10-CM | POA: Diagnosis not present

## 2018-07-15 ENCOUNTER — Telehealth: Payer: Self-pay | Admitting: Family Medicine

## 2018-07-15 NOTE — Telephone Encounter (Signed)
Medication Refill - Medication: hydrochlorothiazide (MICROZIDE) 12.5 MG capsule  Has the patient contacted their pharmacy? Yes (Agent: If no, request that the patient contact the pharmacy for the refill.) (Agent: If yes, when and what did the pharmacy advise?)  Preferred Pharmacy (with phone number or street name): [id number] - [place], [place] [address] [place]: Please be advised that RX refills may take up to 3 business days. We ask that you follow-up with your pharmacy.  Pt is completely out of medication and needs refill of it before the weekend is over.

## 2018-07-16 ENCOUNTER — Ambulatory Visit: Payer: Medicare Other | Admitting: Family Medicine

## 2018-07-16 ENCOUNTER — Ambulatory Visit: Payer: Medicare Other | Admitting: Urgent Care

## 2018-07-16 ENCOUNTER — Other Ambulatory Visit: Payer: Self-pay | Admitting: *Deleted

## 2018-07-16 DIAGNOSIS — Z9109 Other allergy status, other than to drugs and biological substances: Secondary | ICD-10-CM

## 2018-07-16 DIAGNOSIS — R0981 Nasal congestion: Secondary | ICD-10-CM

## 2018-07-16 DIAGNOSIS — I10 Essential (primary) hypertension: Secondary | ICD-10-CM

## 2018-07-16 MED ORDER — HYDROCHLOROTHIAZIDE 12.5 MG PO CAPS
12.5000 mg | ORAL_CAPSULE | Freq: Every day | ORAL | 0 refills | Status: DC
Start: 2018-07-16 — End: 2018-10-14

## 2018-07-16 MED ORDER — FLUTICASONE PROPIONATE 50 MCG/ACT NA SUSP
2.0000 | Freq: Every day | NASAL | 3 refills | Status: DC
Start: 2018-07-16 — End: 2019-11-11

## 2018-07-16 MED ORDER — CETIRIZINE HCL 10 MG PO TABS: 10.0000 mg | ORAL_TABLET | Freq: Every day | ORAL | 3 refills | Status: DC

## 2018-09-08 ENCOUNTER — Encounter: Payer: Medicare Other | Admitting: Family Medicine

## 2018-10-14 ENCOUNTER — Other Ambulatory Visit: Payer: Self-pay | Admitting: Family Medicine

## 2018-10-14 DIAGNOSIS — I10 Essential (primary) hypertension: Secondary | ICD-10-CM

## 2018-10-14 NOTE — Telephone Encounter (Signed)
Requested medication (s) are due for refill today: yes  Requested medication (s) are on the active medication list: yes  Last refill:  10/14/2018  Future visit scheduled:yes  Notes to clinic: requesting 90 day supply   Requested Prescriptions  Pending Prescriptions Disp Refills   hydrochlorothiazide (MICROZIDE) 12.5 MG capsule [Pharmacy Med Name: HYDROCHLOROTHIAZIDE 12.5MG  CAPSULES] 90 capsule     Sig: TAKE 1 CAPSULE(12.5 MG) BY MOUTH DAILY     Cardiovascular: Diuretics - Thiazide Failed - 10/14/2018  8:55 AM      Failed - Ca in normal range and within 360 days    Calcium  Date Value Ref Range Status  07/10/2017 9.3 8.7 - 10.3 mg/dL Final         Failed - Cr in normal range and within 360 days    Creat  Date Value Ref Range Status  06/14/2015 0.81 0.50 - 0.99 mg/dL Final   Creatinine, Ser  Date Value Ref Range Status  07/10/2017 0.75 0.57 - 1.00 mg/dL Final         Failed - K in normal range and within 360 days    Potassium  Date Value Ref Range Status  07/10/2017 4.0 3.5 - 5.2 mmol/L Final         Failed - Na in normal range and within 360 days    Sodium  Date Value Ref Range Status  07/10/2017 137 134 - 144 mmol/L Final         Failed - Valid encounter within last 6 months    Recent Outpatient Visits          1 year ago Annual physical exam   [facility] at [place], [place]   2 years ago Essential hypertension   [facility] at [place], [place], [place]   3 years ago Annual physical exam   [facility] at [place], [place]   4 years ago Annual physical exam   [facility] at [place], [place], [place]   5 years ago Annual physical exam   [facility] at [facility], [place], [place]      Future Appointments            In 1 week Rutherford Guys, MD [facility] at [place], [place] BP in normal range    BP Readings from Last 1 Encounters:  07/10/17 120/62

## 2018-10-27 ENCOUNTER — Other Ambulatory Visit: Payer: Self-pay

## 2018-10-27 ENCOUNTER — Ambulatory Visit (INDEPENDENT_AMBULATORY_CARE_PROVIDER_SITE_OTHER): Payer: Medicare Other | Admitting: Family Medicine

## 2018-10-27 ENCOUNTER — Encounter: Payer: Self-pay | Admitting: Family Medicine

## 2018-10-27 VITALS — BP 135/84 | HR 89 | Temp 98.0°F | Ht 67.5 in | Wt 182.2 lb

## 2018-10-27 DIAGNOSIS — Z01419 Encounter for gynecological examination (general) (routine) without abnormal findings: Secondary | ICD-10-CM

## 2018-10-27 DIAGNOSIS — Z0001 Encounter for general adult medical examination with abnormal findings: Secondary | ICD-10-CM | POA: Diagnosis not present

## 2018-10-27 DIAGNOSIS — Z0000 Encounter for general adult medical examination without abnormal findings: Secondary | ICD-10-CM

## 2018-10-27 DIAGNOSIS — H6123 Impacted cerumen, bilateral: Secondary | ICD-10-CM

## 2018-10-27 DIAGNOSIS — Z23 Encounter for immunization: Secondary | ICD-10-CM

## 2018-10-27 DIAGNOSIS — I10 Essential (primary) hypertension: Secondary | ICD-10-CM

## 2018-10-27 MED ORDER — HYDROCHLOROTHIAZIDE 12.5 MG PO CAPS: ORAL_CAPSULE | ORAL | 3 refills | Status: DC

## 2018-10-27 NOTE — Progress Notes (Signed)
Presents today for [employer] Annual Wellness Visit-Subsequent.   Date of last exam: July 10 2017  Interpreter used for this visit? no  Patient Care Team: Myles LippsSantiago, Iliya Spivack M, MD as PCP - General (Family Medicine)   Other items to address today: ear pain  Having left ear pain, decreased hearing   Cancer Screening: Cervical: n/a Breast: yes, march 2019 Colon: yes, due jan 2021  Other Screening: Last screening for diabetes: 2019 Last lipid screening: 2019 Eye: had exam 2 weeks ago Dentist: needs to schedule  ADVANCE DIRECTIVES: Discussed: no, patient declines On File: no Materials Provided: no  Immunization status:  Immunization History  Administered Date(s) Administered  . Pneumococcal Conjugate-13 06/14/2015  . Pneumococcal Polysaccharide-23 07/10/2017  . Tdap 06/08/2014     There are no preventive care reminders to display for this patient.   Functional Status Survey: Is the patient deaf or have difficulty hearing?: Yes(left fluid in the ear) Does the patient have difficulty seeing, even when wearing glasses/contacts?: No Does the patient have difficulty concentrating, remembering, or making decisions?: No Does the patient have difficulty walking or climbing stairs?: No Does the patient have difficulty dressing or bathing?: No Does the patient have difficulty doing errands alone such as visiting a doctor's office or shopping?: No   6CIT Screen 10/27/2018  What Year? 0 points  What month? 0 points  What time? 0 points  Count back from 20 0 points  Months in reverse 0 points  Repeat phrase 0 points  Total Score 0     Home Environment: no safety concerns  Urinary Incontinence Screening: denies  Patient Active Problem List   Diagnosis Date Noted  . Essential hypertension 06/08/2014  . Hyperlipidemia 06/08/2014     Past Medical History:  Diagnosis Date  . Hyperlipidemia   . Hypertension   . IBS (irritable bowel syndrome)      Past  Surgical History:  Procedure Laterality Date  . ABDOMINAL HYSTERECTOMY    . COSMETIC SURGERY       Family History  Problem Relation Age of Onset  . Cancer Mother        lung  . Hypertension Maternal Grandmother      Social History   Socioeconomic History  . Marital status: Married    Spouse name: Not on file  . Number of children: Not on file  . Years of education: Not on file  . Highest education level: Not on file  Occupational History  . Not on file  Social Needs  . Financial resource strain: Not on file  . Food insecurity    Worry: Not on file    Inability: Not on file  . Transportation needs    Medical: Not on file    Non-medical: Not on file  Tobacco Use  . Smoking status: Never Smoker  . Smokeless tobacco: Never Used  Substance and Sexual Activity  . Alcohol use: No  . Drug use: No  . Sexual activity: Yes  Lifestyle  . Physical activity    Days per week: Not on file    Minutes per session: Not on file  . Stress: Not on file  Relationships  . Social Musicianconnections    Talks on phone: Not on file    Gets together: Not on file    Attends religious service: Not on file    Active member of club or organization: Not on file    Attends meetings of clubs or organizations: Not on file  Relationship status: Not on file  . Intimate partner violence    Fear of current or ex partner: Not on file    Emotionally abused: Not on file    Physically abused: Not on file    Forced sexual activity: Not on file  Other Topics Concern  . Not on file  Social History Narrative  . Not on file     Allergies  Allergen Reactions  . Morphine And Related Anaphylaxis  . Hydrochlorothiazide Hives    Tablet form only     Prior to Admission medications   Medication Sig Start Date End Date Taking? Authorizing Provider  cetirizine (ZYRTEC) 10 MG tablet Take 1 tablet (10 mg total) by mouth daily. 07/16/18  Yes Rutherford Guys, MD  fluticasone [facility]) 50 MCG/ACT nasal spray  Place 2 sprays into both nostrils daily. 07/16/18 07/16/19 Yes Rutherford Guys, MD  hydrochlorothiazide (MICROZIDE) 12.5 MG capsule TAKE 1 CAPSULE(12.5 MG) BY MOUTH DAILY 10/14/18  Yes Rutherford Guys, MD  Ibuprofen (ADVIL) 200 MG CAPS Take by mouth.   Yes [provider]  pseudoephedrine (SUDAFED) 60 MG tablet Take 1 tablet (60 mg total) by mouth every 8 (eight) hours as needed for congestion (and ear fullness). 07/10/17  Yes Jaynee Eagles, PA-C  Zoster Vaccine Live, PF, (ZOSTAVAX) 81017 UNT/0.65ML injection Zostavax (PF) 19,400 unit/0.65 mL subcutaneous suspension   Yes [provider]     Depression screen [facility] [place] 2/9 10/27/2018 07/10/2017 06/25/2016 06/14/2015 06/08/2014  Decreased Interest 0 0 0 0 0  Down, Depressed, Hopeless 0 0 0 0 0  PHQ - 2 Score 0 0 0 0 0     Fall Risk  10/27/2018 07/10/2017 06/25/2016 06/14/2015  Falls in the past year? 0 No No No  Number falls in past yr: 0 - - -  Injury with Fall? 0 - - -    Review of Systems  Constitutional: Negative for chills and fever.  HENT: Positive for ear pain and hearing loss. Negative for ear discharge, sinus pain and sore throat.   Respiratory: Negative for cough and shortness of breath.   Cardiovascular: Negative for chest pain, palpitations and leg swelling.  Gastrointestinal: Negative for abdominal pain, nausea and vomiting.  Genitourinary: Negative for frequency and urgency.  Musculoskeletal: Negative for falls.  Endo/Heme/Allergies: Positive for environmental allergies.  All other systems reviewed and are negative.   PHYSICAL EXAM: BP 135/84   Pulse 89   Temp 98 F (36.7 C)   Ht 5' 7.5" (1.715 m)   Wt 182 lb 3.2 oz (82.6 kg)   SpO2 98%   BMI 28.12 kg/m    Wt Readings from Last 3 Encounters:  10/27/18 182 lb 3.2 oz (82.6 kg)  07/10/17 183 lb 12.8 oz (83.4 kg)  06/25/16 177 lb 9.6 oz (80.6 kg)    Physical Exam  Constitutional: She is oriented to person, place, and time and well-developed, well-nourished,  and in no distress.  HENT:  Head: Normocephalic and atraumatic.  Right Ear: Hearing, tympanic membrane, external ear and ear canal normal.  Left Ear: Hearing, tympanic membrane, external ear and ear canal normal.  Mouth/Throat: Oropharynx is clear and moist.  Eyes: Pupils are equal, round, and reactive to light. EOM are normal.  Neck: Neck supple. No thyromegaly present.  Cardiovascular: Normal rate, regular rhythm, normal heart sounds and intact distal pulses. Exam reveals no gallop and no friction rub.  No murmur heard. Pulmonary/Chest: Effort normal and breath sounds normal. She has no wheezes. She has  no rales. Right breast exhibits no mass, no nipple discharge, no skin change and no tenderness. Left breast exhibits no mass, no nipple discharge, no skin change and no tenderness.  Abdominal: Soft. Bowel sounds are normal. She exhibits no distension and no mass. There is no abdominal tenderness.  Musculoskeletal: Normal range of motion.        General: No edema.  Lymphadenopathy:    She has no cervical adenopathy.    She has no axillary adenopathy.       Right axillary: No pectoral and no lateral adenopathy present.       Left axillary: No pectoral and no lateral adenopathy present. Neurological: She is alert and oriented to person, place, and time. She has normal reflexes. Gait normal.  Skin: Skin is warm and dry.  Psychiatric: Mood and affect normal.  Nursing note and vitals reviewed.     Education/Counseling provided regarding diet and exercise, prevention of chronic diseases, smoking/tobacco cessation, if applicable, and reviewed "Covered Medicare Preventive Services."   ASSESSMENT/PLAN:  1. Encounter for Medicare annual wellness exam HCM reviewed/discussed. Anticipatory guidance regarding healthy weight, lifestyle and choices given.   2. Encounter for gynecological examination without abnormal finding  3. Essential hypertension Controlled. Continue current regime.  -  Basic Metabolic Panel - hydrochlorothiazide (MICROZIDE) 12.5 MG capsule; TAKE 1 CAPSULE(12.5 MG) BY MOUTH DAILY  4. Bilateral impacted cerumen - Ear Lavage - done by CMA wo issues  5. Need for prophylactic vaccination and inoculation against influenza - Flu Vaccine QUAD High Dose(Fluad)  Other orders   Return in about 1 year (around 10/27/2019).

## 2018-10-27 NOTE — Patient Instructions (Addendum)
   If you have lab work done today you will be contacted with your lab results within the next 2 weeks.  If you have not heard from us then please contact us. The fastest way to get your results is to register for My Chart.   IF you received an x-ray today, you will receive an invoice from [facility]. Please contact [facility] at [phone] with questions or concerns regarding your invoice.   IF you received labwork today, you will receive an invoice from [facility]. Please contact [facility] at [phone] with questions or concerns regarding your invoice.   Our billing staff will not be able to assist you with questions regarding bills from these companies.  You will be contacted with the lab results as soon as they are available. The fastest way to get your results is to activate your My Chart account. Instructions are located on the last page of this paperwork. If you have not heard from us regarding the results in 2 weeks, please contact this office.     Preventive Care 65 Years and Older, Female Preventive care refers to lifestyle choices and visits with your health care provider that can promote health and wellness. This includes:  A yearly physical exam. This is also called an annual well check.  Regular dental and eye exams.  Immunizations.  Screening for certain conditions.  Healthy lifestyle choices, such as diet and exercise. What can I expect for my preventive care visit? Physical exam Your health care provider will check:  Height and weight. These may be used to calculate body mass index (BMI), which is a measurement that tells if you are at a healthy weight.  Heart rate and blood pressure.  Your skin for abnormal spots. Counseling Your health care provider may ask you questions about:  Alcohol, tobacco, and drug use.  Emotional well-being.  Home and relationship well-being.  Sexual activity.  Eating habits.  History of  falls.  Memory and ability to understand (cognition).  Work and work environment.  Pregnancy and menstrual history. What immunizations do I need?  Influenza (flu) vaccine  This is recommended every year. Tetanus, diphtheria, and pertussis (Tdap) vaccine  You may need a Td booster every 10 years. Varicella (chickenpox) vaccine  You may need this vaccine if you have not already been vaccinated. Zoster (shingles) vaccine  You may need this after age 60. Pneumococcal conjugate (PCV13) vaccine  One dose is recommended after age 65. Pneumococcal polysaccharide (PPSV23) vaccine  One dose is recommended after age 65. Measles, mumps, and rubella (MMR) vaccine  You may need at least one dose of MMR if you were born in 1957 or later. You may also need a second dose. Meningococcal conjugate (MenACWY) vaccine  You may need this if you have certain conditions. Hepatitis A vaccine  You may need this if you have certain conditions or if you travel or work in places where you may be exposed to hepatitis A. Hepatitis B vaccine  You may need this if you have certain conditions or if you travel or work in places where you may be exposed to hepatitis B. Haemophilus influenzae type b (Hib) vaccine  You may need this if you have certain conditions. You may receive vaccines as individual doses or as more than one vaccine together in one shot (combination vaccines). Talk with your health care provider about the risks and benefits of combination vaccines. What tests do I need? Blood tests  Lipid and cholesterol levels. These   may be checked every 5 years, or more frequently depending on your overall health.  Hepatitis C test.  Hepatitis B test. Screening  Lung cancer screening. You may have this screening every year starting at age 55 if you have a 30-pack-year history of smoking and currently smoke or have quit within the past 15 years.  Colorectal cancer screening. All adults should  have this screening starting at age 50 and continuing until age 75. Your health care provider may recommend screening at age 45 if you are at increased risk. You will have tests every 1-10 years, depending on your results and the type of screening test.  Diabetes screening. This is done by checking your blood sugar (glucose) after you have not eaten for a while (fasting). You may have this done every 1-3 years.  Mammogram. This may be done every 1-2 years. Talk with your health care provider about how often you should have regular mammograms.  BRCA-related cancer screening. This may be done if you have a family history of breast, ovarian, tubal, or peritoneal cancers. Other tests  Sexually transmitted disease (STD) testing.  Bone density scan. This is done to screen for osteoporosis. You may have this done starting at age 65. Follow these instructions at home: Eating and drinking  Eat a diet that includes fresh fruits and vegetables, whole grains, lean protein, and low-fat dairy products. Limit your intake of foods with high amounts of sugar, saturated fats, and salt.  Take vitamin and mineral supplements as recommended by your health care provider.  Do not drink alcohol if your health care provider tells you not to drink.  If you drink alcohol: ? Limit how much you have to 0-1 drink a day. ? Be aware of how much alcohol is in your drink. In the [place] one drink equals one 12 oz bottle of beer (355 mL), one 5 oz glass of wine (148 mL), or one 1 oz glass of hard liquor (44 mL). Lifestyle  Take daily care of your teeth and gums.  Stay active. Exercise for at least 30 minutes on 5 or more days each week.  Do not use any products that contain nicotine or tobacco, such as cigarettes, e-cigarettes, and chewing tobacco. If you need help quitting, ask your health care provider.  If you are sexually active, practice safe sex. Use a condom or other form of protection in order to prevent STIs  (sexually transmitted infections).  Talk with your health care provider about taking a low-dose aspirin or statin. What's next?  Go to your health care provider once a year for a well check visit.  Ask your health care provider how often you should have your eyes and teeth checked.  Stay up to date on all vaccines. This information is not intended to replace advice given to you by your health care provider. Make sure you discuss any questions you have with your health care provider. Document Released: 01/27/2015 Document Revised: 12/25/2017 Document Reviewed: 12/25/2017 Elsevier Patient Education  2020 Elsevier Inc.  

## 2018-10-28 LAB — BASIC METABOLIC PANEL
BUN/Creatinine Ratio: 25 (ref 12–28)
BUN: 17 mg/dL (ref 8–27)
CO2: 23 mmol/L (ref 20–29)
Calcium: 9.8 mg/dL (ref 8.7–10.3)
Chloride: 101 mmol/L (ref 96–106)
Creatinine, Ser: 0.69 mg/dL (ref 0.57–1.00)
GFR calc Af Amer: 103 mL/min/{1.73_m2} (ref >59)
GFR calc non Af Amer: 90 mL/min/{1.73_m2} (ref >59)
Glucose: 98 mg/dL (ref 65–99)
Potassium: 4.3 mmol/L (ref 3.5–5.2)
Sodium: 139 mmol/L (ref 134–144)

## 2019-01-18 ENCOUNTER — Other Ambulatory Visit: Payer: Self-pay | Admitting: Family Medicine

## 2019-01-18 MED ORDER — CETIRIZINE HCL 10 MG PO TABS: 10.0000 mg | ORAL_TABLET | Freq: Every day | ORAL | 3 refills | Status: DC

## 2019-01-18 NOTE — Telephone Encounter (Signed)
Medication: cetirizine (ZYRTEC) 10 MG tablet [[id number]]   Has the patient contacted their pharmacy? Yes  (Agent: If no, request that the patient contact the pharmacy for the refill.) (Agent: If yes, when and what did the pharmacy advise?)  Preferred Pharmacy (with phone number or street name): [id number] [place], [place] - [address] AT [facility] [address]  Phone:  [phone] Fax:  [phone]     Agent: Please be advised that RX refills may take up to 3 business days. We ask that you follow-up with your pharmacy.

## 2019-03-13 ENCOUNTER — Other Ambulatory Visit: Payer: Self-pay | Admitting: Family Medicine

## 2019-10-18 ENCOUNTER — Telehealth: Payer: Self-pay | Admitting: Family Medicine

## 2019-10-19 ENCOUNTER — Other Ambulatory Visit: Payer: Self-pay

## 2019-10-19 ENCOUNTER — Telehealth: Payer: Medicare Other | Admitting: Family Medicine

## 2019-11-11 ENCOUNTER — Other Ambulatory Visit: Payer: Self-pay

## 2019-11-11 ENCOUNTER — Ambulatory Visit (INDEPENDENT_AMBULATORY_CARE_PROVIDER_SITE_OTHER): Payer: Medicare Other | Admitting: Family Medicine

## 2019-11-11 ENCOUNTER — Encounter: Payer: Self-pay | Admitting: Family Medicine

## 2019-11-11 VITALS — BP 142/84 | HR 89 | Temp 97.0°F | Ht 67.5 in | Wt 179.2 lb

## 2019-11-11 DIAGNOSIS — Z0000 Encounter for general adult medical examination without abnormal findings: Secondary | ICD-10-CM

## 2019-11-11 DIAGNOSIS — Z0001 Encounter for general adult medical examination with abnormal findings: Secondary | ICD-10-CM

## 2019-11-11 DIAGNOSIS — I10 Essential (primary) hypertension: Secondary | ICD-10-CM

## 2019-11-11 MED ORDER — HYDROCHLOROTHIAZIDE 12.5 MG PO CAPS: ORAL_CAPSULE | ORAL | 3 refills | Status: DC

## 2019-11-11 NOTE — Progress Notes (Signed)
Patient ID: Carla Welch, female    DOB: [birth year]  Age: 69 y.o. MRN: [id number]  Chief Complaint  Patient presents with  . Medicare Wellness    will call and make mammo and not sure about the cologuard right now, Wants to [employer]     Subjective:   Patient is here for her annual physical examination.  She has no major acute from plaints.  She has been healthy.  Her only regular medication is her hydrochlorothiazide.  She is allergic to the tablets but can take the capsules so that is what she continues to use.   Operations: Para 1 para 1.  She had surgery for an ankle fracture and hand surgery in 1975.  Medical illnesses: None history of PID many years ago caused bythe [place] shield. She has not had her COVID-19 vaccine.  She did have Covid in January 2020 for about 3 weeks.  She has been reluctant to get the vaccine but encouraged her to do so.  She plans to get the flu shot at the pharmacy.   Past medical she has been married for 52 years.  She has 1 child and 2 grandchildren.  Her husband still works.  She still works part-time as a Airline pilot at [employer].  Does not drink or smoke or use drugs.  She does not get regular exercise other than at work.  Family history: Father died at 45 of prostate cancer, mother at 41 of lung disease  Review of systems: Constitutional: Unremarkable HEENT: Unremarkable Cardiovascular: Unremarkable GI: She thinks she has irritable bowel syndrome that she manages by some dietary changes and not eating gluten. GU: Unremarkable Musculoskeletal aches and pains, otherwise unremarkable Dermatologic: Unremarkable Neurologic: Unremarkable Psychiatric: Unremarkable Endocrine: Unremarkable     Current allergies, medications, problem list, past/family and social histories reviewed.  Objective:  BP (!) 142/84   Pulse 89   Temp (!) 97 F (36.1 C)   Ht 5' 7.5" (1.715 m)   Wt 179 lb 3.2 oz (81.3 kg)   SpO2 96%   BMI 27.65 kg/m    Physical exam: Healthy-appearing lady alert and oriented no acute distress.  TMs has a little dry wax bilaterally but I can see portions of both drums.  Throat clear.  Eyes PERRL.  She is missing 1 lower molar that she is apparently going to get a post put in.  Neck supple without nodes or thyromegaly.  No carotid bruits.  Chest is clear to auscultation.  Heart regular without murmurs.  Breasts are soft without masses.  No axillary nodes.  Abdomen soft that mass or tenderness.  No inguinal nodes.  Extremities without edema.  Skin warm and dry.  Has scattered moles.    Assessment & Plan:   Assessment: 1. Annual physical exam   2. Essential hypertension       Plan: See instructions  Orders Placed This Encounter  Procedures  . TSH  . Lipid panel  . CMP14+EGFR    Meds ordered this encounter  Medications  . hydrochlorothiazide (MICROZIDE) 12.5 MG capsule    Sig: TAKE 1 CAPSULE(12.5 MG) BY MOUTH DAILY    Dispense:  90 capsule    Refill:  3         Patient Instructions    Remember to seek to be physically, emotionally, relationally, and spiritually healthy.  It is best to be proactively healthy.  I encourage you to reconsider the COVID-19 vaccination.  Evidence indicates that people who have had Covid  are still better protected if they get the vaccine also.  The Covid alone gives you some good protection, but best protection is the ideal.  I encourage you to get regular daily exercise.  Try for about 30 minutes 5 days a week.  Continue the hydrochlorothiazide as previously.  Please get your blood pressure checked somewhere else a time or 2.  Today it was slightly elevated at 142/84.  Get the flu shot at the pharmacy as you have already planned.  Return as needed or in 1 year.   If you have lab work done today you will be contacted with your lab results within the next 2 weeks.  If you have not heard from [place] then please contact us. The fastest way to get your results  is to register for My Chart.   IF you received an x-ray today, you will receive an invoice from [facility]. Please contact [facility] at [phone] with questions or concerns regarding your invoice.   IF you received labwork today, you will receive an invoice from [facility]. Please contact [facility] at [phone] with questions or concerns regarding your invoice.   Our billing staff will not be able to assist you with questions regarding bills from these companies.  You will be contacted with the lab results as soon as they are available. The fastest way to get your results is to activate your My Chart account. Instructions are located on the last page of this paperwork. If you have not heard from [place] regarding the results in 2 weeks, please contact this office.        Return in about 1 year (around 11/10/2020) for Annual physical.  Fenton Malling. Linna Darner, MD

## 2019-11-11 NOTE — Patient Instructions (Addendum)
  Remember to seek to be physically, emotionally, relationally, and spiritually healthy.  It is best to be proactively healthy.  I encourage you to reconsider the COVID-19 vaccination.  Evidence indicates that people who have had Covid are still better protected if they get the vaccine also.  The Covid alone gives you some good protection, but best protection is the ideal.  I encourage you to get regular daily exercise.  Try for about 30 minutes 5 days a week.  Continue the hydrochlorothiazide as previously.  Please get your blood pressure checked somewhere else a time or 2.  Today it was slightly elevated at 142/84.  Get the flu shot at the pharmacy as you have already planned.  Return as needed or in 1 year.   If you have lab work done today you will be contacted with your lab results within the next 2 weeks.  If you have not heard from [place] then please contact us. The fastest way to get your results is to register for My Chart.   IF you received an x-ray today, you will receive an invoice from [facility]. Please contact [facility] at [phone] with questions or concerns regarding your invoice.   IF you received labwork today, you will receive an invoice from [facility]. Please contact [facility] at [phone] with questions or concerns regarding your invoice.   Our billing staff will not be able to assist you with questions regarding bills from these companies.  You will be contacted with the lab results as soon as they are available. The fastest way to get your results is to activate your My Chart account. Instructions are located on the last page of this paperwork. If you have not heard from [place] regarding the results in 2 weeks, please contact this office.

## 2019-11-12 LAB — CMP14+EGFR
ALT: 15 IU/L (ref 0–32)
AST: 17 IU/L (ref 0–40)
Albumin/Globulin Ratio: 1.8 (ref 1.2–2.2)
Albumin: 4.6 g/dL (ref 3.8–4.8)
Alkaline Phosphatase: 81 IU/L (ref 44–121)
BUN/Creatinine Ratio: 16 (ref 12–28)
BUN: 14 mg/dL (ref 8–27)
Bilirubin Total: 0.4 mg/dL (ref 0.0–1.2)
CO2: 25 mmol/L (ref 20–29)
Calcium: 9.8 mg/dL (ref 8.7–10.3)
Chloride: 98 mmol/L (ref 96–106)
Creatinine, Ser: 0.89 mg/dL (ref 0.57–1.00)
GFR calc Af Amer: 76 mL/min/{1.73_m2} (ref >59)
GFR calc non Af Amer: 66 mL/min/{1.73_m2} (ref >59)
Globulin, Total: 2.6 g/dL (ref 1.5–4.5)
Glucose: 107 mg/dL — ABNORMAL HIGH (ref 65–99)
Potassium: 4.1 mmol/L (ref 3.5–5.2)
Sodium: 138 mmol/L (ref 134–144)
Total Protein: 7.2 g/dL (ref 6.0–8.5)

## 2019-11-12 LAB — LIPID PANEL
Chol/HDL Ratio: 4.1 ratio (ref 0.0–4.4)
Cholesterol, Total: 208 mg/dL — ABNORMAL HIGH (ref 100–199)
HDL: 51 mg/dL (ref >39)
LDL Chol Calc (NIH): 121 mg/dL — ABNORMAL HIGH (ref 0–99)
Triglycerides: 204 mg/dL — ABNORMAL HIGH (ref 0–149)
VLDL Cholesterol Cal: 36 mg/dL (ref 5–40)

## 2019-11-12 LAB — TSH: TSH: 1.8 u[IU]/mL (ref 0.450–4.500)

## 2020-02-29 DIAGNOSIS — H25013 Cortical age-related cataract, bilateral: Secondary | ICD-10-CM | POA: Diagnosis not present

## 2020-02-29 DIAGNOSIS — H25043 Posterior subcapsular polar age-related cataract, bilateral: Secondary | ICD-10-CM | POA: Diagnosis not present

## 2020-02-29 DIAGNOSIS — H2513 Age-related nuclear cataract, bilateral: Secondary | ICD-10-CM | POA: Diagnosis not present

## 2020-02-29 DIAGNOSIS — H2512 Age-related nuclear cataract, left eye: Secondary | ICD-10-CM | POA: Diagnosis not present

## 2020-02-29 DIAGNOSIS — H18413 Arcus senilis, bilateral: Secondary | ICD-10-CM | POA: Diagnosis not present

## 2020-04-27 NOTE — Telephone Encounter (Signed)
Opened in error

## 2020-05-22 DIAGNOSIS — H25812 Combined forms of age-related cataract, left eye: Secondary | ICD-10-CM | POA: Diagnosis not present

## 2020-05-22 DIAGNOSIS — H2512 Age-related nuclear cataract, left eye: Secondary | ICD-10-CM | POA: Diagnosis not present

## 2020-05-23 DIAGNOSIS — H25011 Cortical age-related cataract, right eye: Secondary | ICD-10-CM | POA: Diagnosis not present

## 2020-05-23 DIAGNOSIS — H2511 Age-related nuclear cataract, right eye: Secondary | ICD-10-CM | POA: Diagnosis not present

## 2020-05-23 DIAGNOSIS — H25041 Posterior subcapsular polar age-related cataract, right eye: Secondary | ICD-10-CM | POA: Diagnosis not present

## 2020-06-16 DIAGNOSIS — H2511 Age-related nuclear cataract, right eye: Secondary | ICD-10-CM | POA: Diagnosis not present

## 2020-10-20 ENCOUNTER — Ambulatory Visit (INDEPENDENT_AMBULATORY_CARE_PROVIDER_SITE_OTHER): Payer: Medicare Other | Admitting: Nurse Practitioner

## 2020-10-20 ENCOUNTER — Encounter: Payer: Self-pay | Admitting: Nurse Practitioner

## 2020-10-20 ENCOUNTER — Other Ambulatory Visit: Payer: Self-pay

## 2020-10-20 VITALS — BP 132/78 | HR 94 | Temp 98.2°F | Ht 67.0 in | Wt 188.0 lb

## 2020-10-20 DIAGNOSIS — Z1329 Encounter for screening for other suspected endocrine disorder: Secondary | ICD-10-CM | POA: Diagnosis not present

## 2020-10-20 DIAGNOSIS — E785 Hyperlipidemia, unspecified: Secondary | ICD-10-CM

## 2020-10-20 DIAGNOSIS — I10 Essential (primary) hypertension: Secondary | ICD-10-CM | POA: Diagnosis not present

## 2020-10-20 DIAGNOSIS — Z131 Encounter for screening for diabetes mellitus: Secondary | ICD-10-CM

## 2020-10-20 DIAGNOSIS — E663 Overweight: Secondary | ICD-10-CM

## 2020-10-20 LAB — CBC WITH DIFFERENTIAL/PLATELET
Basophils Absolute: 0.1 10*3/uL (ref 0.0–0.1)
Basophils Relative: 0.6 % (ref 0.0–3.0)
Eosinophils Absolute: 0.1 10*3/uL (ref 0.0–0.7)
Eosinophils Relative: 1.3 % (ref 0.0–5.0)
HCT: 40.7 % (ref 36.0–46.0)
Hemoglobin: 13.7 g/dL (ref 12.0–15.0)
Lymphocytes Relative: 25.6 % (ref 12.0–46.0)
Lymphs Abs: 2.1 10*3/uL (ref 0.7–4.0)
MCHC: 33.6 g/dL (ref 30.0–36.0)
MCV: 94.1 fl (ref 78.0–100.0)
Monocytes Absolute: 0.8 10*3/uL (ref 0.1–1.0)
Monocytes Relative: 9.8 % (ref 3.0–12.0)
Neutro Abs: 5.1 10*3/uL (ref 1.4–7.7)
Neutrophils Relative %: 62.7 % (ref 43.0–77.0)
Platelets: 217 10*3/uL (ref 150.0–400.0)
RBC: 4.33 Mil/uL (ref 3.87–5.11)
RDW: 12.5 % (ref 11.5–15.5)
WBC: 8.2 10*3/uL (ref 4.0–10.5)

## 2020-10-20 LAB — COMPREHENSIVE METABOLIC PANEL
ALT: 15 U/L (ref 0–35)
AST: 19 U/L (ref 0–37)
Albumin: 4.3 g/dL (ref 3.5–5.2)
Alkaline Phosphatase: 75 U/L (ref 39–117)
BUN: 18 mg/dL (ref 6–23)
CO2: 30 mEq/L (ref 19–32)
Calcium: 9.5 mg/dL (ref 8.4–10.5)
Chloride: 99 mEq/L (ref 96–112)
Creatinine, Ser: 0.91 mg/dL (ref 0.40–1.20)
GFR: 63.74 mL/min (ref >60.00)
Glucose, Bld: 93 mg/dL (ref 70–99)
Potassium: 3.5 mEq/L (ref 3.5–5.1)
Sodium: 138 mEq/L (ref 135–145)
Total Bilirubin: 0.4 mg/dL (ref 0.2–1.2)
Total Protein: 7.5 g/dL (ref 6.0–8.3)

## 2020-10-20 LAB — LDL CHOLESTEROL, DIRECT: Direct LDL: 140 mg/dL

## 2020-10-20 LAB — LIPID PANEL
Cholesterol: 212 mg/dL — ABNORMAL HIGH (ref 0–200)
HDL: 48.4 mg/dL (ref >39.00)
NonHDL: 163.67
Total CHOL/HDL Ratio: 4
Triglycerides: 351 mg/dL — ABNORMAL HIGH (ref 0.0–149.0)
VLDL: 70.2 mg/dL — ABNORMAL HIGH (ref 0.0–40.0)

## 2020-10-20 LAB — TSH: TSH: 1.91 u[IU]/mL (ref 0.35–5.50)

## 2020-10-20 MED ORDER — HYDROCHLOROTHIAZIDE 12.5 MG PO CAPS
ORAL_CAPSULE | ORAL | 1 refills | Status: DC
Start: 2020-10-20 — End: 2021-04-16

## 2020-10-20 NOTE — Progress Notes (Signed)
Subjective:  Patient ID: Carla Welch, female    DOB: 29-Jul-1950  Age: 70 y.o. MRN: [id number]  CC:  Chief Complaint  Patient presents with   New Patient (Initial Visit)    Manage conditions. Med refill of hydrochlorothiazide       HPI  This patient arrives today for the above.  She has no acute concerns but is asking for a refill on her hydrochlorothiazide.  She has an allergy to the hydrochlorothiazide tablets, but tolerates the capsules fine.  She is also due for blood work.  Past Medical History:  Diagnosis Date   Hyperlipidemia    Hypertension    IBS (irritable bowel syndrome)       Family History  Problem Relation Age of Onset   Cancer Mother        lung   Hypertension Maternal Grandmother     Social History   Social History Narrative   Not on file   Social History   Tobacco Use   Smoking status: Never   Smokeless tobacco: Never  Substance Use Topics   Alcohol use: No     Current Meds  Medication Sig   [DISCONTINUED] hydrochlorothiazide (MICROZIDE) 12.5 MG capsule TAKE 1 CAPSULE(12.5 MG) BY MOUTH DAILY    ROS:  Review of Systems  Constitutional:  Negative for fever and weight loss.  Eyes:  Negative for blurred vision.  Respiratory:  Negative for shortness of breath.   Cardiovascular:  Negative for chest pain.  Neurological:  Negative for dizziness, loss of consciousness and headaches.    Objective:   Today's Vitals: BP 132/78   Pulse 94   Temp 98.2 F (36.8 C) (Oral)   Ht 5\' 7"  (1.702 m)   Wt 188 lb (85.3 kg)   SpO2 96%   BMI 29.44 kg/m  Vitals with BMI 10/20/2020 11/11/2019 10/27/2018  Height 5\' 7"  5' 7.5" 5' 7.5"  Weight 188 lbs 179 lbs 3 oz 182 lbs 3 oz  BMI 29.44 27.64 28.1  Systolic 132 142 10/29/2018  Diastolic 78 84 84  Pulse 94 89 89     Physical Exam Vitals reviewed.  Constitutional:      General: She is not in acute distress.    Appearance: Normal appearance.  HENT:     Head: Normocephalic and atraumatic.  Neck:      Vascular: No carotid bruit.  Cardiovascular:     Rate and Rhythm: Normal rate and regular rhythm.     Pulses: Normal pulses.     Heart sounds: Normal heart sounds.  Pulmonary:     Effort: Pulmonary effort is normal.     Breath sounds: Normal breath sounds.  Skin:    General: Skin is warm and dry.  Neurological:     General: No focal deficit present.     Mental Status: She is alert and oriented to person, place, and time.  Psychiatric:        Mood and Affect: Mood normal.        Behavior: Behavior normal.        Judgment: Judgment normal.         Assessment and Plan   1. Hyperlipidemia, unspecified hyperlipidemia type   2. Essential hypertension   3. Screening for diabetes mellitus   4. Thyroid disorder screening   5. Overweight      Plan: 1.-5.  We will refill hydrochlorothiazide today, and we will check blood work for further evaluation.   Tests ordered Orders Placed This  Encounter  Procedures   TSH   Lipid panel   Comprehensive metabolic panel   CBC with Differential/Platelet      Meds ordered this encounter  Medications   hydrochlorothiazide (MICROZIDE) 12.5 MG capsule    Sig: TAKE 1 CAPSULE(12.5 MG) BY MOUTH DAILY    Dispense:  90 capsule    Refill:  1    Order Specific Question:   Supervising Provider    Answer:   Carla Welch [[id number]]    Patient to follow-up in 6 months for complete physical exam or sooner as needed.  Carla Paddy, NP

## 2020-10-20 NOTE — Addendum Note (Signed)
Addended by: Waldemar Dickens B on: 10/20/2020 03:28 PM   Modules accepted: Orders

## 2020-10-21 ENCOUNTER — Other Ambulatory Visit: Payer: Self-pay | Admitting: Nurse Practitioner

## 2020-10-21 DIAGNOSIS — E785 Hyperlipidemia, unspecified: Secondary | ICD-10-CM

## 2020-10-21 NOTE — Progress Notes (Signed)
lipi

## 2021-01-11 ENCOUNTER — Other Ambulatory Visit: Payer: Self-pay | Admitting: Nurse Practitioner

## 2021-01-11 DIAGNOSIS — Z1329 Encounter for screening for other suspected endocrine disorder: Secondary | ICD-10-CM

## 2021-01-11 DIAGNOSIS — I10 Essential (primary) hypertension: Secondary | ICD-10-CM

## 2021-01-11 DIAGNOSIS — E785 Hyperlipidemia, unspecified: Secondary | ICD-10-CM

## 2021-01-11 DIAGNOSIS — Z131 Encounter for screening for diabetes mellitus: Secondary | ICD-10-CM

## 2021-01-11 DIAGNOSIS — E663 Overweight: Secondary | ICD-10-CM

## 2021-04-16 ENCOUNTER — Telehealth: Payer: Self-pay | Admitting: Nurse Practitioner

## 2021-04-16 DIAGNOSIS — I10 Essential (primary) hypertension: Secondary | ICD-10-CM

## 2021-04-16 DIAGNOSIS — E663 Overweight: Secondary | ICD-10-CM

## 2021-04-16 DIAGNOSIS — E785 Hyperlipidemia, unspecified: Secondary | ICD-10-CM

## 2021-04-16 DIAGNOSIS — Z1329 Encounter for screening for other suspected endocrine disorder: Secondary | ICD-10-CM

## 2021-04-16 DIAGNOSIS — Z131 Encounter for screening for diabetes mellitus: Secondary | ICD-10-CM

## 2021-04-16 MED ORDER — HYDROCHLOROTHIAZIDE 12.5 MG PO CAPS
ORAL_CAPSULE | ORAL | 1 refills | Status: DC
Start: 2021-04-16 — End: 2021-10-26

## 2021-04-16 NOTE — Telephone Encounter (Signed)
Rx sent 

## 2021-04-16 NOTE — Telephone Encounter (Signed)
1.Medication Requested: ?hydrochlorothiazide (MICROZIDE) 12.5 MG capsule ?2. Pharmacy (Name, Street, [place]): ?[id number] - [place], [place] - [address] AT [facility] [address] [facility] [address] Phone:  [phone]  ?Fax:  [phone]  ?  ? ?3. On Med List: y ? ?4. Last Visit with PCP: ? ?5. Next visit date with PCP: ? ? ?Agent: Please be advised that RX refills may take up to 3 business days. We ask that you follow-up with your pharmacy.  ?

## 2021-04-19 ENCOUNTER — Ambulatory Visit: Payer: Medicare Other | Admitting: Nurse Practitioner

## 2021-05-17 ENCOUNTER — Ambulatory Visit (INDEPENDENT_AMBULATORY_CARE_PROVIDER_SITE_OTHER): Payer: Medicare Other | Admitting: Nurse Practitioner

## 2021-05-17 VITALS — BP 134/82 | HR 78 | Temp 98.0°F | Ht 67.0 in | Wt 182.0 lb

## 2021-05-17 DIAGNOSIS — E785 Hyperlipidemia, unspecified: Secondary | ICD-10-CM

## 2021-05-17 DIAGNOSIS — Z0001 Encounter for general adult medical examination with abnormal findings: Secondary | ICD-10-CM

## 2021-05-17 DIAGNOSIS — N182 Chronic kidney disease, stage 2 (mild): Secondary | ICD-10-CM

## 2021-05-17 LAB — LIPID PANEL
Cholesterol: 213 mg/dL — ABNORMAL HIGH (ref 0–200)
HDL: 47.8 mg/dL (ref >39.00)
NonHDL: 165.15
Total CHOL/HDL Ratio: 4
Triglycerides: 224 mg/dL — ABNORMAL HIGH (ref 0.0–149.0)
VLDL: 44.8 mg/dL — ABNORMAL HIGH (ref 0.0–40.0)

## 2021-05-17 LAB — BASIC METABOLIC PANEL
BUN: 17 mg/dL (ref 6–23)
CO2: 28 mEq/L (ref 19–32)
Calcium: 9.4 mg/dL (ref 8.4–10.5)
Chloride: 97 mEq/L (ref 96–112)
Creatinine, Ser: 0.85 mg/dL (ref 0.40–1.20)
GFR: 68.89 mL/min (ref >60.00)
Glucose, Bld: 103 mg/dL — ABNORMAL HIGH (ref 70–99)
Potassium: 3.4 mEq/L — ABNORMAL LOW (ref 3.5–5.1)
Sodium: 134 mEq/L — ABNORMAL LOW (ref 135–145)

## 2021-05-17 LAB — LDL CHOLESTEROL, DIRECT: Direct LDL: 145 mg/dL

## 2021-05-17 NOTE — Patient Instructions (Signed)
Red Yeast Rice Capsules ?What is this medication? ?RED YEAST RICE (red yeest rahys) may support healthy cholesterol levels. The FDA has not evaluated this supplement for any medical use. It may contain ingredients not listed. Discuss all supplements you are taking with your care team. They can provide you with important safety information. ?This medicine may be used for other purposes; ask your health care provider or pharmacist if you have questions. ?COMMON BRAND NAME(S): Red Yeast Rice ?What should I tell my care team before I take this medication? ?They need to know if you have any of these conditions: ?Frequently drink alcohol ?Kidney disease ?Liver disease ?Muscle aches or weakness ?Other medical condition ?An unusual or allergic reaction to red yeast rice, went yeast, lovastatin, other 'statin' medications, other supplements, foods, dyes, or preservatives ?Pregnant or trying to get pregnant ?Breast-feeding ?How should I use this medication? ?Take this supplement by mouth with a glass of water. Follow the directions on the package labeling, or take as directed by your care team. Do not take this supplement more often than directed. ?Contact your care team about the use of this supplement in children. Special care may be needed. This supplement is not recommended for use in children. ?Overdosage: If you think you have taken too much of this medicine contact a poison control center or emergency room at once. ?NOTE: This medicine is only for you. Do not share this medicine with others. ?What if I miss a dose? ?If you miss a dose, take it as soon as you can. If it is almost time for your next dose, take only that dose. Do not take double or extra doses. ?What may interact with this medication? ?Do not take this medication with any of the following: ?Clarithromycin ?Delavirdine ?Erythromycin ?Grapefruit juice ?Protease inhibitors used to treat HIV infection ?Medications for fungal infections, such as itraconazole,  ketoconazole, posaconazole, and voriconazole ?Mibefradil ?Nefazodone ?Other medications for high cholesterol ?Telithromycin ?Troleandomycin ?This medication may also interact with the following: ?Alcohol ?Amiodarone ?Colchicine ?Cyclosporine ?Danazol ?Diltiazem ?Fenofibrate ?Fluconazole ?Gemfibrozil ?Mifepristone, RU-486 ?Niacin ?St. John's Wort ?Verapamil ?Voriconazole ?Warfarin ?This list may not describe all possible interactions. Give your health care provider a list of all the medicines, herbs, non-prescription drugs, or dietary supplements you use. Also tell them if you smoke, drink alcohol, or use illegal drugs. Some items may interact with your medicine. ?What should I watch for while using this medication? ?Visit your care team for regular check-ups. You may need regular tests to make sure your liver is working properly. ?Tell you care team right away if you get any unexplained muscle pain, tenderness, or weakness, especially if you also have a fever and tiredness. ?Some medications may increase the risk of side effects from this supplement. If you are given certain antibiotics or antifungals, you should stop taking this supplement during those treatments. Check with your care team or pharmacist for advice. ?If you are scheduled for any medical or dental procedure, tell your care team that you are taking this supplement. You may need to stop taking this supplement before the procedure. ?Do not use this supplement if you are pregnant or breast-feeding. Serious side effects to an unborn child or to an infant are possible. Talk to your care team or pharmacist for more information. ?Herbal or dietary supplements are not regulated like medications. Rigid quality control standards are not required for dietary supplements. The purity and strength of these products can vary. The safety and effect of this dietary supplement for a  certain disease or illness is not well known. This product is not intended to diagnose,  treat, cure or prevent any disease. ?The Food and Drug Administration suggests the following to help consumers protect themselves: ?Always read product labels and follow directions. ?Natural does not mean a product is safe for humans to take. ?Look for products that include USP after the ingredient name. This means that the manufacturer followed the standards of the [place] Pharmacopoeia. ?Supplements made or sold by a nationally known food or drug company are more likely to be made under tight controls. You can write to the company for more information about how the product was made. ?What side effects may I notice from receiving this medication? ?Side effects that you should report to your care team as soon as possible: ?Allergic reactions--skin rash, itching, hives, swelling of the face, lips, tongue, or throat ?Liver injury--right upper belly pain, loss of appetite, nausea, light-colored stool, dark yellow or brown urine, yellowing skin or eyes, unusual weakness or fatigue ?Muscle injury--unusual weakness or fatigue, muscle pain, dark yellow or brown urine, decrease in amount of urine ?Side effects that usually do not require medical attention (report to your care team if they continue or are bothersome): ?Dizziness ?Gas ?Headache ?Heartburn ?This list may not describe all possible side effects. Call your doctor for medical advice about side effects. You may report side effects to FDA at 1-800-FDA-1088. ?Where should I keep my medication? ?Keep out of the reach of children and pets. ?Store at room temperature between 15 and 30 degrees C (59 and 86 degrees F). Get rid of any unused supplement after the expiration date. ?NOTE: This sheet is a summary. It may not cover all possible information. If you have questions about this medicine, talk to your doctor, pharmacist, or health care provider. ?? 2023 Elsevier/Gold Standard (2020-12-13 00:00:00) ? ?

## 2021-05-17 NOTE — Assessment & Plan Note (Signed)
Overall physical exam grossly normal. Patient plans on scheduling mammogram for breast cancer screening in the near future.  She would like to hold off on colon cancer screening at this time.  Hep C screening has been completed in the past.  ?

## 2021-05-17 NOTE — Progress Notes (Signed)
? ? ?Established Patient Office Visit ? ?Subjective   ?Patient ID: Carla Welch, female    DOB: Aug 09, 1950  Age: 71 y.o. MRN: [id number] ? ?Chief Complaint  ?Patient presents with  ? well visit and follow up  ?  Patient has no concerns or questions today  ? ? ?Patient arrives for an physical exam today.  She is also here to discuss last blood work results. ? ?Health maintenance: She is due for colon cancer screening and breast cancer screening.  The system shows that she has not had shingles vaccine, however she tells me she has had it before. ? ?Hyperlipidemia: Blood work was collected at her last office visit which showed total cholesterol 212, LDL of 140, and triglycerides of 351.  Current ASCVD risk score is approximately 15.7%.  She is not currently on any statin therapy.  Patient reports that she would prefer to not treat her elevated cholesterol with prescription medication.  She prefer to treat with supplements and her lifestyle interventions.  Last lipid panel was collected in a nonfasting state. ? ?CKD stage II: Blood work also showed EGFR of 63. ? ? ? ? ? ? ? ?Past Medical History:  ?Diagnosis Date  ? Hyperlipidemia   ? Hypertension   ? IBS (irritable bowel syndrome)   ? ?Past Surgical History:  ?Procedure Laterality Date  ? ABDOMINAL HYSTERECTOMY    ? COSMETIC SURGERY    ? ?Family History  ?Problem Relation Age of Onset  ? Cancer Mother   ?     lung  ? Hypertension Maternal Grandmother   ? ?Allergies  ?Allergen Reactions  ? Morphine And Related Anaphylaxis  ? Hydrochlorothiazide Hives  ?  Tablet form only  ? ?  ? ?Review of Systems  ?Constitutional:  Negative for fever, malaise/fatigue and weight loss.  ?Respiratory:  Negative for cough and shortness of breath.   ?Cardiovascular:  Negative for chest pain and palpitations.  ?Gastrointestinal:  Negative for abdominal pain and blood in stool.  ?Neurological:  Positive for headaches (sinus headache; associated with seasonal allergies). Negative for  dizziness.  ?Psychiatric/Behavioral:  Positive for depression (mild). Negative for suicidal ideas.   ? ? ? ?  05/17/2021  ?  8:51 AM 10/20/2020  ?  3:01 PM 10/27/2018  ? 11:52 AM  ?PHQ9 SCORE ONLY  ?PHQ-9 Total Score 4 0 0  ? ? ?  ?Objective:  ?  ? ?BP 134/82 (BP Location: Right Arm, Patient Position: Sitting, Cuff Size: Large)   Pulse 78   Temp 98 ?F (36.7 ?C) (Oral)   Ht '5\' 7"'  (1.702 m)   Wt 182 lb (82.6 kg)   SpO2 94%   BMI 28.51 kg/m?  ?BP Readings from Last 3 Encounters:  ?05/17/21 134/82  ?10/20/20 132/78  ?11/11/19 (!) 142/84  ? ?  ? ?Physical Exam ?Vitals reviewed. Exam conducted with a chaperone present.  ?Constitutional:   ?   Appearance: Normal appearance.  ?HENT:  ?   Head: Normocephalic and atraumatic.  ?   Right Ear: Tympanic membrane, ear canal and external ear normal.  ?   Left Ear: Tympanic membrane, ear canal and external ear normal.  ?Eyes:  ?   General:     ?   Right eye: No discharge.     ?   Left eye: No discharge.  ?   Extraocular Movements: Extraocular movements intact.  ?   Conjunctiva/sclera: Conjunctivae normal.  ?   Pupils: Pupils are equal, round, and reactive to light.  ?  Neck:  ?   Vascular: No carotid bruit.  ?Cardiovascular:  ?   Rate and Rhythm: Normal rate and regular rhythm.  ?   Pulses: Normal pulses.  ?   Heart sounds: Normal heart sounds. No murmur heard. ?Pulmonary:  ?   Effort: Pulmonary effort is normal.  ?   Breath sounds: Normal breath sounds.  ?Chest:  ?Breasts: ?   Breasts are symmetrical.  ?   Right: Normal.  ?   Left: Normal.  ?   Comments: Max Sane as chaperone ?Abdominal:  ?   General: Abdomen is flat. Bowel sounds are normal. There is no distension.  ?   Palpations: Abdomen is soft. There is no mass.  ?   Tenderness: There is no abdominal tenderness.  ?Musculoskeletal:     ?   General: No tenderness.  ?   Cervical back: Neck supple. No muscular tenderness.  ?   Right lower leg: No edema.  ?   Left lower leg: No edema.  ?Lymphadenopathy:  ?   Cervical: No  cervical adenopathy.  ?   Upper Body:  ?   Right upper body: No supraclavicular adenopathy.  ?   Left upper body: No supraclavicular adenopathy.  ?Skin: ?   General: Skin is warm and dry.  ?Neurological:  ?   General: No focal deficit present.  ?   Mental Status: She is alert and oriented to person, place, and time.  ?   Motor: No weakness.  ?   Gait: Gait normal.  ?Psychiatric:     ?   Mood and Affect: Mood normal.     ?   Behavior: Behavior normal.     ?   Judgment: Judgment normal.  ? ? ? ?No results found for any visits on 05/17/21. ? ?Last lipids ?Lab Results  ?Component Value Date  ? CHOL 212 (H) 10/20/2020  ? HDL 48.40 10/20/2020  ? LDLCALC 121 (H) 11/11/2019  ? LDLDIRECT 140.0 10/20/2020  ? TRIG 351.0 (H) 10/20/2020  ? CHOLHDL 4 10/20/2020  ? ?  ? ? ? ?  ?Assessment & Plan:  ? ?Problem List Items Addressed This Visit   ? ?  ? Genitourinary  ? CKD (chronic kidney disease) stage 2, GFR 60-89 ml/min  ?  Last EGFR was 63.  I did inform her that this would indicate her kidney function is not considered normal, we did discuss possibly checking urine for signs of albuminuria.  She would like to hold off on this for now because regardless of the findings she does not want to start prescription medication.  She is encouraged to let me know if she would like to check this in the future as this could be a sign that kidney function could get progressively worse at a faster rate than expected.  She reports understanding, but would still like to hold off on urine testing today. ? ?  ?  ?  ? Other  ? Hyperlipidemia  ?  Per shared decision making, patient will have repeat lipid panel today as she is fasting.  May consider medication as treatment pending that panel result.  Patient prefers to try lifestyle or supplement treatments prior to prescription medication if possible.  I did discuss she could consider trying red yeast rice, as well as to focus on a healthy diet and increase physical activity as tolerated.  We also  discussed statin versus Zetia, as stated above she would like to hold off on these medications for now. ? ?  ?  ?  Relevant Orders  ? Lipid Profile  ? Basic Metabolic Panel (BMET)  ? Encounter for general adult medical examination with abnormal findings - Primary  ?  Overall physical exam grossly normal. Patient plans on scheduling mammogram for breast cancer screening in the near future.  She would like to hold off on colon cancer screening at this time.  Hep C screening has been completed in the past.  ? ?  ?  ? ? ?Return in about 3 months (around 08/17/2021) for Follow-up with Judson Roch in 3-6 months; annual exam with Judson Roch in 1 year.  ? ? ?Ailene Ards, NP ? ?

## 2021-05-17 NOTE — Assessment & Plan Note (Signed)
Last EGFR was 63.  I did inform her that this would indicate her kidney function is not considered normal, we did discuss possibly checking urine for signs of albuminuria.  She would like to hold off on this for now because regardless of the findings she does not want to start prescription medication.  She is encouraged to let me know if she would like to check this in the future as this could be a sign that kidney function could get progressively worse at a faster rate than expected.  She reports understanding, but would still like to hold off on urine testing today. ?

## 2021-05-17 NOTE — Assessment & Plan Note (Addendum)
Per shared decision making, patient will have repeat lipid panel today as she is fasting.  May consider medication as treatment pending that panel result.  Patient prefers to try lifestyle or supplement treatments prior to prescription medication if possible.  I did discuss she could consider trying red yeast rice, as well as to focus on a healthy diet and increase physical activity as tolerated.  We also discussed statin versus Zetia, as stated above she would like to hold off on these medications for now. ?

## 2021-08-16 ENCOUNTER — Ambulatory Visit: Payer: Medicare Other | Admitting: Nurse Practitioner

## 2021-09-28 ENCOUNTER — Other Ambulatory Visit: Payer: Self-pay | Admitting: Nurse Practitioner

## 2021-09-28 DIAGNOSIS — E663 Overweight: Secondary | ICD-10-CM

## 2021-09-28 DIAGNOSIS — I10 Essential (primary) hypertension: Secondary | ICD-10-CM

## 2021-09-28 DIAGNOSIS — Z1329 Encounter for screening for other suspected endocrine disorder: Secondary | ICD-10-CM

## 2021-09-28 DIAGNOSIS — E785 Hyperlipidemia, unspecified: Secondary | ICD-10-CM

## 2021-09-28 DIAGNOSIS — Z131 Encounter for screening for diabetes mellitus: Secondary | ICD-10-CM

## 2021-10-03 ENCOUNTER — Telehealth: Payer: Self-pay | Admitting: Nurse Practitioner

## 2021-10-03 NOTE — Telephone Encounter (Signed)
Called pt to schedule AWV. Patient was upset because every time she comes to the doctor she has to pay $200 when she has Medicare A&B and [employer]. I informed her to call the billing office. Stated she will do that. I will call patient back on 10/08/21

## 2021-10-11 ENCOUNTER — Telehealth: Payer: Self-pay | Admitting: Nurse Practitioner

## 2021-10-11 NOTE — Telephone Encounter (Signed)
LVM for pt to rtn my call to schedule AWV with [facility] call back # [phone] 

## 2021-10-25 ENCOUNTER — Telehealth: Payer: Self-pay

## 2021-10-25 ENCOUNTER — Other Ambulatory Visit: Payer: Self-pay | Admitting: *Deleted

## 2021-10-25 DIAGNOSIS — I10 Essential (primary) hypertension: Secondary | ICD-10-CM

## 2021-10-25 DIAGNOSIS — E663 Overweight: Secondary | ICD-10-CM

## 2021-10-25 DIAGNOSIS — E785 Hyperlipidemia, unspecified: Secondary | ICD-10-CM

## 2021-10-25 DIAGNOSIS — Z1329 Encounter for screening for other suspected endocrine disorder: Secondary | ICD-10-CM

## 2021-10-25 DIAGNOSIS — Z131 Encounter for screening for diabetes mellitus: Secondary | ICD-10-CM

## 2021-10-25 NOTE — Telephone Encounter (Signed)
MEDICATION: hydrochlorothiazide (MICROZIDE) 12.5 MG capsule  PHARMACY: [id number] - [place], [place] - [address]  Comments: [facility]  **Let patient know to contact pharmacy at the end of the day to make sure medication is ready. **  ** Please notify patient to allow 48-72 hours to process**  **Encourage patient to contact the pharmacy for refills or they can request refills through [facility]**

## 2021-10-26 MED ORDER — HYDROCHLOROTHIAZIDE 12.5 MG PO CAPS: ORAL_CAPSULE | ORAL | 1 refills | Status: DC

## 2021-10-26 NOTE — Telephone Encounter (Signed)
[facility] to [facility]..../lmb 

## 2021-11-06 ENCOUNTER — Ambulatory Visit (INDEPENDENT_AMBULATORY_CARE_PROVIDER_SITE_OTHER): Payer: Medicare Other | Admitting: *Deleted

## 2021-11-06 ENCOUNTER — Ambulatory Visit: Payer: Medicare Other

## 2021-11-06 DIAGNOSIS — Z1231 Encounter for screening mammogram for malignant neoplasm of breast: Secondary | ICD-10-CM

## 2021-11-06 DIAGNOSIS — Z0000 Encounter for general adult medical examination without abnormal findings: Secondary | ICD-10-CM

## 2021-11-06 NOTE — Progress Notes (Signed)
Subjective:   Carla Welch is a 71 y.o. female who presents for Medicare Annual (Subsequent) preventive examination. I connected with  Carla Welch on 11/06/21 by a audio enabled telemedicine application and verified that I am speaking with the correct person using two identifiers.  Patient Location: Home  Provider Location: Home Office  I discussed the limitations of evaluation and management by telemedicine. The patient expressed understanding and agreed to proceed.  Review of Systems    Deferred to PCP Cardiac Risk Factors include: advanced age (>31men, >71 women);dyslipidemia;hypertension     Objective:    Today's Vitals   11/06/21 1523  PainSc: 2    There is no height or weight on file to calculate BMI.     11/06/2021    3:36 PM 11/11/2019    9:19 AM 10/27/2018   11:55 AM  Advanced Directives  Does Patient Have a Medical Advance Directive? No No No  Would patient like information on creating a medical advance directive? No - Patient declined No - Patient declined No - Patient declined    Current Medications (verified) Outpatient Encounter Medications as of 11/06/2021  Medication Sig   hydrochlorothiazide (MICROZIDE) 12.5 MG capsule TAKE 1 CAPSULE(12.5 MG) BY MOUTH DAILY   No facility-administered encounter medications on file as of 11/06/2021.    Allergies (verified) Morphine and related and Hydrochlorothiazide   History: Past Medical History:  Diagnosis Date   Hyperlipidemia    Hypertension    IBS (irritable bowel syndrome)    Past Surgical History:  Procedure Laterality Date   ABDOMINAL HYSTERECTOMY     COSMETIC SURGERY     Family History  Problem Relation Age of Onset   Cancer Mother        lung   Hypertension Maternal Grandmother    Social History   Socioeconomic History   Marital status: Married    Spouse name: Human resources officer   Number of children: Not on file   Years of education: 11th   Highest education level: Not on file  Occupational  History   Not on file  Tobacco Use   Smoking status: Never   Smokeless tobacco: Never  Vaping Use   Vaping Use: Never used  Substance and Sexual Activity   Alcohol use: No   Drug use: No   Sexual activity: Yes  Other Topics Concern   Not on file  Social History Narrative   Not on file   Social Determinants of Health   Financial Resource Strain: Low Risk  (11/06/2021)   Overall Financial Resource Strain (CARDIA)    Difficulty of Paying Living Expenses: Not hard at all  Food Insecurity: No Food Insecurity (11/06/2021)   Hunger Vital Sign    Worried About Running Out of Food in the Last Year: Never true    Ran Out of Food in the Last Year: Never true  Transportation Needs: No Transportation Needs (11/06/2021)   PRAPARE - Hydrologist (Medical): No    Lack of Transportation (Non-Medical): No  Physical Activity: Insufficiently Active (11/06/2021)   Exercise Vital Sign    Days of Exercise per Week: 4 days    Minutes of Exercise per Session: 30 min  Stress: No Stress Concern Present (11/06/2021)   [place]    Feeling of Stress : Only a little  Social Connections: Moderately Isolated (11/06/2021)   Social Connection and Isolation Panel [NHANES]    Frequency of Communication with Friends  and Family: More than three times a week    Frequency of Social Gatherings with Friends and Family: More than three times a week    Attends Religious Services: Never    Marine scientist or Organizations: No    Attends Music therapist: Never    Marital Status: Married    Tobacco Counseling Counseling given: Not Answered   Clinical Intake:  Pre-visit preparation completed: Yes  Pain : 0-10 Pain Score: 2  Pain Type: Chronic pain Pain Location: Ankle Pain Orientation: Left Pain Descriptors / Indicators: Aching, Discomfort Pain Relieving Factors: OMEGA XL  Pain Relieving  Factors: OMEGA XL  Nutritional Status: BMI 25 -29 Overweight Nutritional Risks: None Diabetes: No  How often do you need to have someone help you when you read instructions, pamphlets, or other written materials from your doctor or pharmacy?: 1 - Never What is the last grade level you completed in school?: 11th  Diabetic?No  Interpreter Needed?: No  Information entered by :: Emelia Loron RN   Activities of Daily Living    11/06/2021    3:35 PM  In your present state of health, do you have any difficulty performing the following activities:  Hearing? 0  Vision? 0  Difficulty concentrating or making decisions? 0  Walking or climbing stairs? 0  Dressing or bathing? 0  Doing errands, shopping? 0  Preparing Food and eating ? N  Using the Toilet? N  In the past six months, have you accidently leaked urine? N  Do you have problems with loss of bowel control? N  Managing your Medications? N  Managing your Finances? N  Housekeeping or managing your Housekeeping? N    Patient Care Team: Ailene Ards, NP as PCP - General (Nurse Practitioner)  Indicate any recent Medical Services you may have received from other than [facility] providers in the past year (date may be approximate).     Assessment:   This is a routine wellness examination for Carla Welch.  Hearing/Vision screen No results found.  Dietary issues and exercise activities discussed: Current Exercise Habits: The patient has a physically strenuous job, but has no regular exercise apart from work. (reports staying active around 5 hours per day), Exercise limited by: orthopedic condition(s)   Goals Addressed             This Visit's Progress    Patient Stated       Start to eat healthier and drink more water.      Depression Screen    11/06/2021    3:31 PM 05/17/2021    8:51 AM 10/20/2020    3:01 PM 10/27/2018   11:52 AM 07/10/2017    9:13 AM 06/25/2016    8:21 AM 06/14/2015    8:20 AM  PHQ 2/9 Scores  PHQ - 2 Score 0  0 0 0 0 0 0  PHQ- 9 Score  4         Fall Risk    11/06/2021    3:37 PM 05/17/2021    8:52 AM 10/20/2020    3:02 PM 11/11/2019    9:17 AM 10/27/2018   11:52 AM  Fall Risk   Falls in the past year? 1 0 0 0 0  Number falls in past yr: 0  0 0 0  Injury with Fall? 0  0 0 0  Risk for fall due to : No Fall Risks      Follow up Falls evaluation completed  FALL RISK PREVENTION PERTAINING TO THE HOME:  Any stairs in or around the home? Yes  If so, are there any without handrails? Yes  Home free of loose throw rugs in walkways, pet beds, electrical cords, etc? Yes  Adequate lighting in your home to reduce risk of falls? Yes   ASSISTIVE DEVICES UTILIZED TO PREVENT FALLS:  Life alert? No  Use of a cane, walker or w/c? No  Grab bars in the bathroom? No  Shower chair or bench in shower? No  Elevated toilet seat or a handicapped toilet? No   Cognitive Function:        11/06/2021    3:38 PM 11/11/2019    9:18 AM 10/27/2018   11:52 AM  6CIT Screen  What Year? 0 points 0 points 0 points  What month? 0 points 0 points 0 points  What time? 0 points 0 points 0 points  Count back from 20 0 points 0 points 0 points  Months in reverse 0 points 0 points 0 points  Repeat phrase 0 points 0 points 0 points  Total Score 0 points 0 points 0 points    Immunizations Immunization History  Administered Date(s) Administered   Fluad Quad(high Dose 65+) 10/27/2018   Pneumococcal Conjugate-13 06/14/2015   Pneumococcal Polysaccharide-23 07/10/2017   Tdap 06/08/2014    TDAP status: Up to date  Flu Vaccine status: Due, Education has been provided regarding the importance of this vaccine. Advised may receive this vaccine at local pharmacy or Health Dept. Aware to provide a copy of the vaccination record if obtained from local pharmacy or Health Dept. Verbalized acceptance and understanding.  Pneumococcal vaccine status: Up to date  Covid-19 vaccine status: Information provided on how to  obtain vaccines.   Qualifies for Shingles Vaccine? Yes   Zostavax completed No   Shingrix Completed?: No.    Education has been provided regarding the importance of this vaccine. Patient has been advised to call insurance company to determine out of pocket expense if they have not yet received this vaccine. Advised may also receive vaccine at local pharmacy or Health Dept. Verbalized acceptance and understanding.  Screening Tests Health Maintenance  Topic Date Due   COVID-19 Vaccine (1) Never done   Zoster Vaccines- Shingrix (1 of 2) Never done   INFLUENZA VACCINE  04/14/2022 (Originally 08/14/2021)   MAMMOGRAM  05/18/2022 (Originally 03/29/2019)   COLONOSCOPY (Pts 45-91yrs Insurance coverage will need to be confirmed)  05/18/2022 (Originally 01/15/2019)   Medicare Annual Wellness (AWV)  12/07/2022   TETANUS/TDAP  06/07/2024   Pneumonia Vaccine 51+ Years old  Completed   DEXA SCAN  Completed   Hepatitis C Screening  Completed   HPV VACCINES  Aged Out    Health Maintenance  Health Maintenance Due  Topic Date Due   COVID-19 Vaccine (1) Never done   Zoster Vaccines- Shingrix (1 of 2) Never done    Colorectal Screening: Patient states she will discuss with her PCP cologuard  Mammogram status: [place]. Pt provided with contact info and advised to call to schedule appt.   Bone Density: Patient declined at this time  Lung Cancer Screening: (Low Dose CT Chest recommended if Age 50-80 years, 30 pack-year currently smoking OR have quit w/in 15years.) does not qualify.   Additional Screening:  Hepatitis C Screening: does qualify; Completed 07/10/17  Vision Screening: Recommended annual ophthalmology exams for early detection of glaucoma and other disorders of the eye. Is the patient up to date with their annual  eye exam?  No  Who is the provider or what is the name of the office in which the patient attends annual eye exams? Dr. Talbert Forest If pt is not established with a  provider, would they like to be referred to a provider to establish care?  N/A .   Dental Screening: Recommended annual dental exams for proper oral hygiene  Community Resource Referral / Chronic Care Management: CRR required this visit?  No   CCM required this visit?  No      Plan:     I have personally reviewed and noted the following in the patient's chart:   Medical and social history Use of alcohol, tobacco or illicit drugs  Current medications and supplements including opioid prescriptions. Patient is not currently taking opioid prescriptions. Functional ability and status Nutritional status Physical activity Advanced directives List of other physicians Hospitalizations, surgeries, and ER visits in previous 12 months Vitals Screenings to include cognitive, depression, and falls Referrals and appointments  In addition, I have reviewed and discussed with patient certain preventive protocols, quality metrics, and best practice recommendations. A written personalized care plan for preventive services as well as general preventive health recommendations were provided to patient.     Michiel Cowboy, RN   11/06/2021   Nurse Notes:  Ms. Boley , Thank you for taking time to come for your Medicare Wellness Visit. I appreciate your ongoing commitment to your health goals. Please review the following plan we discussed and let me know if I can assist you in the future.   These are the goals we discussed:  Goals      Patient Stated     Start to eat healthier and drink more water.        This is a list of the screening recommended for you and due dates:  Health Maintenance  Topic Date Due   COVID-19 Vaccine (1) Never done   Zoster (Shingles) Vaccine (1 of 2) Never done   Flu Shot  04/14/2022*   Mammogram  05/18/2022*   Colon Cancer Screening  05/18/2022*   Medicare Annual Wellness Visit  12/07/2022   Tetanus Vaccine  06/07/2024   Pneumonia Vaccine  Completed   DEXA  scan (bone density measurement)  Completed   Hepatitis C Screening: USPSTF Recommendation to screen - Ages 46-79 yo.  Completed   HPV Vaccine  Aged Out  *Topic was postponed. The date shown is not the original due date.

## 2021-11-06 NOTE — Patient Instructions (Signed)

## 2022-01-25 ENCOUNTER — Other Ambulatory Visit: Payer: Self-pay | Admitting: Nurse Practitioner

## 2022-01-25 DIAGNOSIS — Z1329 Encounter for screening for other suspected endocrine disorder: Secondary | ICD-10-CM

## 2022-01-25 DIAGNOSIS — E785 Hyperlipidemia, unspecified: Secondary | ICD-10-CM

## 2022-01-25 DIAGNOSIS — I10 Essential (primary) hypertension: Secondary | ICD-10-CM

## 2022-01-25 DIAGNOSIS — E663 Overweight: Secondary | ICD-10-CM

## 2022-01-25 DIAGNOSIS — Z131 Encounter for screening for diabetes mellitus: Secondary | ICD-10-CM

## 2022-07-09 DIAGNOSIS — H04123 Dry eye syndrome of bilateral lacrimal glands: Secondary | ICD-10-CM | POA: Diagnosis not present

## 2022-07-09 DIAGNOSIS — H26493 Other secondary cataract, bilateral: Secondary | ICD-10-CM | POA: Diagnosis not present

## 2022-07-13 ENCOUNTER — Other Ambulatory Visit: Payer: Self-pay | Admitting: Nurse Practitioner

## 2022-07-13 DIAGNOSIS — Z131 Encounter for screening for diabetes mellitus: Secondary | ICD-10-CM

## 2022-07-13 DIAGNOSIS — I10 Essential (primary) hypertension: Secondary | ICD-10-CM

## 2022-07-13 DIAGNOSIS — E663 Overweight: Secondary | ICD-10-CM

## 2022-07-13 DIAGNOSIS — Z1329 Encounter for screening for other suspected endocrine disorder: Secondary | ICD-10-CM

## 2022-07-13 DIAGNOSIS — E785 Hyperlipidemia, unspecified: Secondary | ICD-10-CM

## 2022-08-08 ENCOUNTER — Other Ambulatory Visit: Payer: Self-pay | Admitting: Nurse Practitioner

## 2022-08-08 DIAGNOSIS — Z1329 Encounter for screening for other suspected endocrine disorder: Secondary | ICD-10-CM

## 2022-08-08 DIAGNOSIS — E663 Overweight: Secondary | ICD-10-CM

## 2022-08-08 DIAGNOSIS — Z131 Encounter for screening for diabetes mellitus: Secondary | ICD-10-CM

## 2022-08-08 DIAGNOSIS — I10 Essential (primary) hypertension: Secondary | ICD-10-CM

## 2022-08-08 DIAGNOSIS — E785 Hyperlipidemia, unspecified: Secondary | ICD-10-CM

## 2022-08-16 NOTE — Telephone Encounter (Signed)
Patient made appointment for September - can you send her enough to last till appointment - if so please send to [facility] on [address]

## 2022-08-23 ENCOUNTER — Telehealth: Payer: Self-pay | Admitting: Nurse Practitioner

## 2022-08-23 ENCOUNTER — Other Ambulatory Visit: Payer: Self-pay | Admitting: Nurse Practitioner

## 2022-08-23 DIAGNOSIS — I10 Essential (primary) hypertension: Secondary | ICD-10-CM

## 2022-08-23 DIAGNOSIS — E785 Hyperlipidemia, unspecified: Secondary | ICD-10-CM

## 2022-08-23 DIAGNOSIS — E663 Overweight: Secondary | ICD-10-CM

## 2022-08-23 DIAGNOSIS — Z1329 Encounter for screening for other suspected endocrine disorder: Secondary | ICD-10-CM

## 2022-08-23 DIAGNOSIS — Z131 Encounter for screening for diabetes mellitus: Secondary | ICD-10-CM

## 2022-08-23 NOTE — Telephone Encounter (Signed)
Prescription Request  08/23/2022  LOV: Visit date not found Pt has upcoming appt. What is the name of the medication or equipment? hydrochlorothiazide (MICROZIDE) 12.5 MG capsule   Have you contacted your pharmacy to request a refill? No   Which pharmacy would you like this sent to?    [id number] [place], [place] - [address] AT [facility] [address] [place] [address] Phone: [phone] Fax: [phone]  Patient notified that their request is being sent to the clinical staff for review and that they should receive a response within 2 business days.   Please advise at Mobile [phone] (mobile)

## 2022-08-24 ENCOUNTER — Other Ambulatory Visit: Payer: Self-pay | Admitting: Nurse Practitioner

## 2022-08-24 DIAGNOSIS — E785 Hyperlipidemia, unspecified: Secondary | ICD-10-CM

## 2022-08-24 DIAGNOSIS — I10 Essential (primary) hypertension: Secondary | ICD-10-CM

## 2022-08-24 DIAGNOSIS — E663 Overweight: Secondary | ICD-10-CM

## 2022-08-24 DIAGNOSIS — Z131 Encounter for screening for diabetes mellitus: Secondary | ICD-10-CM

## 2022-08-24 DIAGNOSIS — Z1329 Encounter for screening for other suspected endocrine disorder: Secondary | ICD-10-CM

## 2022-08-26 NOTE — Telephone Encounter (Signed)
Pt called stating she would like to come in for labs ASAP to get this done. Please  reach out to pt when labs are ordered so pt can go ahead and get this done. Thanks

## 2022-08-29 ENCOUNTER — Encounter (INDEPENDENT_AMBULATORY_CARE_PROVIDER_SITE_OTHER): Payer: Self-pay

## 2022-08-30 NOTE — Telephone Encounter (Signed)
Pt called back needing labs ordered. Pt stating she need her BP medication.

## 2022-09-04 NOTE — Telephone Encounter (Signed)
Patient called back and still needs these labs ordered.  Please call patient when this has been done so she can have her blood drawn and get her medication.  Phone:  [phone]

## 2022-09-05 ENCOUNTER — Other Ambulatory Visit: Payer: Self-pay | Admitting: Nurse Practitioner

## 2022-09-05 DIAGNOSIS — I10 Essential (primary) hypertension: Secondary | ICD-10-CM

## 2022-09-05 DIAGNOSIS — E785 Hyperlipidemia, unspecified: Secondary | ICD-10-CM

## 2022-09-05 NOTE — Telephone Encounter (Signed)
Please put in labs for patient

## 2022-09-06 NOTE — Telephone Encounter (Signed)
Pt agreed to cholesterol

## 2022-09-06 NOTE — Addendum Note (Signed)
Addended by: Elenore Paddy on: 09/06/2022 08:52 AM   Modules accepted: Orders

## 2022-09-10 ENCOUNTER — Other Ambulatory Visit (INDEPENDENT_AMBULATORY_CARE_PROVIDER_SITE_OTHER): Payer: Medicare Other

## 2022-09-10 DIAGNOSIS — I10 Essential (primary) hypertension: Secondary | ICD-10-CM

## 2022-09-10 DIAGNOSIS — E785 Hyperlipidemia, unspecified: Secondary | ICD-10-CM | POA: Diagnosis not present

## 2022-09-10 LAB — COMPREHENSIVE METABOLIC PANEL
ALT: 13 U/L (ref 0–35)
AST: 18 U/L (ref 0–37)
Albumin: 4.3 g/dL (ref 3.5–5.2)
Alkaline Phosphatase: 80 U/L (ref 39–117)
BUN: 12 mg/dL (ref 6–23)
CO2: 29 mEq/L (ref 19–32)
Calcium: 9.5 mg/dL (ref 8.4–10.5)
Chloride: 98 mEq/L (ref 96–112)
Creatinine, Ser: 0.8 mg/dL (ref 0.40–1.20)
GFR: 73.41 mL/min (ref >60.00)
Glucose, Bld: 104 mg/dL — ABNORMAL HIGH (ref 70–99)
Potassium: 4.1 mEq/L (ref 3.5–5.1)
Sodium: 136 mEq/L (ref 135–145)
Total Bilirubin: 0.6 mg/dL (ref 0.2–1.2)
Total Protein: 7.5 g/dL (ref 6.0–8.3)

## 2022-09-10 LAB — CBC
HCT: 41.3 % (ref 36.0–46.0)
Hemoglobin: 13.7 g/dL (ref 12.0–15.0)
MCHC: 33.2 g/dL (ref 30.0–36.0)
MCV: 95 fl (ref 78.0–100.0)
Platelets: 240 10*3/uL (ref 150.0–400.0)
RBC: 4.35 Mil/uL (ref 3.87–5.11)
RDW: 13.1 % (ref 11.5–15.5)
WBC: 6.6 10*3/uL (ref 4.0–10.5)

## 2022-09-10 LAB — LIPID PANEL
Cholesterol: 205 mg/dL — ABNORMAL HIGH (ref 0–200)
HDL: 50.5 mg/dL (ref >39.00)
LDL Cholesterol: 120 mg/dL — ABNORMAL HIGH (ref 0–99)
NonHDL: 154.85
Total CHOL/HDL Ratio: 4
Triglycerides: 172 mg/dL — ABNORMAL HIGH (ref 0.0–149.0)
VLDL: 34.4 mg/dL (ref 0.0–40.0)

## 2022-09-12 ENCOUNTER — Other Ambulatory Visit: Payer: Self-pay | Admitting: Nurse Practitioner

## 2022-09-12 DIAGNOSIS — E785 Hyperlipidemia, unspecified: Secondary | ICD-10-CM

## 2022-09-12 DIAGNOSIS — I10 Essential (primary) hypertension: Secondary | ICD-10-CM

## 2022-09-12 DIAGNOSIS — Z1329 Encounter for screening for other suspected endocrine disorder: Secondary | ICD-10-CM

## 2022-09-12 DIAGNOSIS — E663 Overweight: Secondary | ICD-10-CM

## 2022-09-12 DIAGNOSIS — Z131 Encounter for screening for diabetes mellitus: Secondary | ICD-10-CM

## 2022-09-12 MED ORDER — HYDROCHLOROTHIAZIDE 12.5 MG PO CAPS
ORAL_CAPSULE | ORAL | 0 refills | Status: DC
Start: 2022-09-12 — End: 2022-10-03

## 2022-10-03 ENCOUNTER — Ambulatory Visit (INDEPENDENT_AMBULATORY_CARE_PROVIDER_SITE_OTHER): Payer: Medicare Other | Admitting: Nurse Practitioner

## 2022-10-03 VITALS — BP 134/76 | HR 89 | Temp 97.8°F | Ht 67.0 in | Wt 176.0 lb

## 2022-10-03 DIAGNOSIS — I10 Essential (primary) hypertension: Secondary | ICD-10-CM

## 2022-10-03 DIAGNOSIS — Z0001 Encounter for general adult medical examination with abnormal findings: Secondary | ICD-10-CM | POA: Diagnosis not present

## 2022-10-03 DIAGNOSIS — E785 Hyperlipidemia, unspecified: Secondary | ICD-10-CM

## 2022-10-03 MED ORDER — ROSUVASTATIN CALCIUM 5 MG PO TABS
5.0000 mg | ORAL_TABLET | Freq: Every day | ORAL | 1 refills | Status: DC
Start: 2022-10-03 — End: 2023-01-13

## 2022-10-03 MED ORDER — HYDROCHLOROTHIAZIDE 12.5 MG PO CAPS
12.5000 mg | ORAL_CAPSULE | Freq: Every day | ORAL | 4 refills | Status: DC
Start: 2022-10-03 — End: 2023-10-02

## 2022-10-03 NOTE — Progress Notes (Signed)
Established Patient Office Visit  Subjective   Patient ID: Carla Welch, female    DOB: February 28, 1950  Age: 72 y.o. MRN: [id number]  Chief Complaint  Patient presents with   Hypertension    Patient arrives today for follow-up. She was lost to care due to not making follow-up appointments mainly because she has had a lot of tragedy  in her family over the last year. She is back today to re-establish. She is on hydrochlorothiazide for hypertension and is tolerating this well. Last metabolic panel identified stable electrolytes and kidney function. She also has hyperlipidemia and would like to discuss this further today. She does have a depressed mood due to all of the stress over the last year but denies suicidal ideation at this time. She is overdue with her breast cancer screening and colon cancer screening. She is due for flu shot as well.     Review of Systems  Constitutional:  Negative for fever, malaise/fatigue and weight loss.  Respiratory:  Negative for cough, shortness of breath and wheezing.   Cardiovascular:  Negative for chest pain and palpitations.  Gastrointestinal:  Negative for abdominal pain and blood in stool.  Skin:  Negative for rash.  Neurological:  Negative for dizziness and headaches.  Psychiatric/Behavioral:  Positive for depression. Negative for suicidal ideas. The patient does not have insomnia.       Objective:     BP 134/76   Pulse 89   Temp 97.8 F (36.6 C) (Temporal)   Ht 5\' 7"  (1.702 m)   Wt 176 lb (79.8 kg)   SpO2 95%   BMI 27.57 kg/m  BP Readings from Last 3 Encounters:  10/03/22 134/76  05/17/21 134/82  10/20/20 132/78   Wt Readings from Last 3 Encounters:  10/03/22 176 lb (79.8 kg)  05/17/21 182 lb (82.6 kg)  10/20/20 188 lb (85.3 kg)        10/03/2022    8:48 AM 11/06/2021    3:31 PM 05/17/2021    8:51 AM  PHQ9 SCORE ONLY  PHQ-9 Total Score 9 0 4     Physical Exam Vitals reviewed. Exam conducted with a chaperone present.   Constitutional:      Appearance: Normal appearance.  HENT:     Head: Normocephalic and atraumatic.     Right Ear: Tympanic membrane, ear canal and external ear normal.     Left Ear: Tympanic membrane, ear canal and external ear normal.  Eyes:     General:        Right eye: No discharge.        Left eye: No discharge.     Extraocular Movements: Extraocular movements intact.     Conjunctiva/sclera: Conjunctivae normal.     Pupils: Pupils are equal, round, and reactive to light.  Neck:     Vascular: No carotid bruit.  Cardiovascular:     Rate and Rhythm: Normal rate and regular rhythm.     Pulses: Normal pulses.     Heart sounds: Normal heart sounds. No murmur heard. Pulmonary:     Effort: Pulmonary effort is normal.     Breath sounds: Normal breath sounds.  Chest:  Breasts:    Breasts are symmetrical.     Right: Normal.     Left: Normal.  Abdominal:     General: Abdomen is flat. Bowel sounds are normal. There is no distension.     Palpations: Abdomen is soft. There is no mass.     Tenderness: There is  no abdominal tenderness.  Musculoskeletal:        General: No tenderness.     Cervical back: Neck supple. No muscular tenderness.     Right lower leg: No edema.     Left lower leg: No edema.  Lymphadenopathy:     Cervical: No cervical adenopathy.     Upper Body:     Right upper body: No supraclavicular adenopathy.     Left upper body: No supraclavicular adenopathy.  Skin:    General: Skin is warm and dry.  Neurological:     General: No focal deficit present.     Mental Status: She is alert and oriented to person, place, and time.     Motor: No weakness.     Gait: Gait normal.  Psychiatric:        Mood and Affect: Mood normal.        Behavior: Behavior normal.        Judgment: Judgment normal.      No results found for any visits on 10/03/22.    The 10-year ASCVD risk score (Arnett DK, et al., 2019) is: 17.1%    Assessment & Plan:   Problem List Items  Addressed This Visit       Cardiovascular and Mediastinum   Essential hypertension    Chronic, stable, controlled Continue hydrochlorothiazide 12.5mg /day      Relevant Medications   hydrochlorothiazide (MICROZIDE) 12.5 MG capsule   rosuvastatin (CRESTOR) 5 MG tablet     Other   Hyperlipidemia    Chronic LDL above goal ASCVD risk score 17% Per shared decision making will start rosuvastatin 5mg /day. Discussed risks and benefits. Patient to f/u in 6-8 weeks for fasting lipid panel, CMP, and office visit .      Relevant Medications   hydrochlorothiazide (MICROZIDE) 12.5 MG capsule   rosuvastatin (CRESTOR) 5 MG tablet   Other Relevant Orders   Lipid panel   Comprehensive metabolic panel   Encounter for general adult medical examination with abnormal findings - Primary    Due for screenings. Patient reports she will schedule her own mammogram. Would like to hold off on colon cancer screening and DEXA scan. Declined flu shot today.        Return in about 6 weeks (around 11/14/2022) for F/U with Dion Parrow.    Elenore Paddy, NP

## 2022-10-03 NOTE — Assessment & Plan Note (Signed)
Chronic LDL above goal ASCVD risk score 17% Per shared decision making will start rosuvastatin 5mg /day. Discussed risks and benefits. Patient to f/u in 6-8 weeks for fasting lipid panel, CMP, and office visit .

## 2022-10-03 NOTE — Assessment & Plan Note (Signed)
Chronic, stable, controlled Continue hydrochlorothiazide 12.5mg /day

## 2022-10-03 NOTE — Assessment & Plan Note (Signed)
Due for screenings. Patient reports she will schedule her own mammogram. Would like to hold off on colon cancer screening and DEXA scan. Declined flu shot today.

## 2022-10-18 ENCOUNTER — Other Ambulatory Visit: Payer: Self-pay | Admitting: Nurse Practitioner

## 2022-10-18 DIAGNOSIS — Z1211 Encounter for screening for malignant neoplasm of colon: Secondary | ICD-10-CM

## 2022-10-18 DIAGNOSIS — Z1212 Encounter for screening for malignant neoplasm of rectum: Secondary | ICD-10-CM

## 2022-11-01 ENCOUNTER — Ambulatory Visit
Admission: RE | Admit: 2022-11-01 | Discharge: 2022-11-01 | Disposition: A | Discharge status: Home / Self Care | Payer: Medicare Other | Source: Ambulatory Visit | Attending: Nurse Practitioner | Admitting: Nurse Practitioner

## 2022-11-01 DIAGNOSIS — Z1231 Encounter for screening mammogram for malignant neoplasm of breast: Secondary | ICD-10-CM | POA: Diagnosis not present

## 2022-11-14 ENCOUNTER — Ambulatory Visit: Payer: Medicare Other

## 2022-11-14 VITALS — Ht 68.0 in | Wt 175.0 lb

## 2022-11-14 DIAGNOSIS — Z0000 Encounter for general adult medical examination without abnormal findings: Secondary | ICD-10-CM

## 2022-11-14 NOTE — Progress Notes (Addendum)
Subjective:   Carla Welch is a 72 y.o. female who presents for Medicare Annual (Subsequent) preventive examination.  Visit Complete: Virtual I connected with  Carla Welch on 11/14/22 by a audio enabled telemedicine application and verified that I am speaking with the correct person using two identifiers.  Patient Location: Home  Provider Location: Office/Clinic  I discussed the limitations of evaluation and management by telemedicine. The patient expressed understanding and agreed to proceed.  Vital Signs: Because this visit was a virtual/telehealth visit, some criteria may be missing or patient reported. Any vitals not documented were not able to be obtained and vitals that have been documented are patient reported.  Patient Medicare AWV questionnaire was completed by the patient on 11/11/2022; I have confirmed that all information answered by patient is correct and no changes since this date.  Cardiac Risk Factors include: advanced age (>73men, >39 women);hypertension;dyslipidemia;Other (see comment), Risk factor comments: CKD     Objective:    Today's Vitals   11/14/22 1524  Weight: 175 lb (79.4 kg)  Height: 5\' 8"  (1.727 m)   Body mass index is 26.61 kg/m.     11/14/2022    3:27 PM 11/06/2021    3:36 PM 11/11/2019    9:19 AM 10/27/2018   11:55 AM  Advanced Directives  Does Patient Have a Medical Advance Directive? No No No No  Would patient like information on creating a medical advance directive?  No - Patient declined No - Patient declined No - Patient declined    Current Medications (verified) Outpatient Encounter Medications as of 11/14/2022  Medication Sig   hydrochlorothiazide (MICROZIDE) 12.5 MG capsule Take 1 capsule (12.5 mg total) by mouth daily. TAKE 1 CAPSULE(12.5 MG) BY MOUTH DAILY   rosuvastatin (CRESTOR) 5 MG tablet Take 1 tablet (5 mg total) by mouth daily.   No facility-administered encounter medications on file as of 11/14/2022.     Allergies (verified) Morphine and codeine and Hydrochlorothiazide   History: Past Medical History:  Diagnosis Date   Hyperlipidemia    Hypertension    IBS (irritable bowel syndrome)    Past Surgical History:  Procedure Laterality Date   ABDOMINAL HYSTERECTOMY     COSMETIC SURGERY     Family History  Problem Relation Age of Onset   Cancer Mother        lung   Hypertension Maternal Grandmother    Social History   Socioeconomic History   Marital status: Married    Spouse name: Nurse, children's   Number of children: Not on file   Years of education: 11th   Highest education level: GED or equivalent  Occupational History   Occupation: Print production planner  Tobacco Use   Smoking status: Never   Smokeless tobacco: Never  Vaping Use   Vaping status: Never Used  Substance and Sexual Activity   Alcohol use: No   Drug use: No   Sexual activity: Yes  Other Topics Concern   Not on file  Social History Narrative   Lives with husband.   Social Determinants of Health   Financial Resource Strain: Medium Risk (11/11/2022)   Overall Financial Resource Strain (CARDIA)    Difficulty of Paying Living Expenses: Somewhat hard  Food Insecurity: No Food Insecurity (11/11/2022)   Hunger Vital Sign    Worried About Running Out of Food in the Last Year: Never true    Ran Out of Food in the Last Year: Never true  Transportation Needs: No Transportation Needs (11/11/2022)   PRAPARE -  Administrator, Civil Service (Medical): No    Lack of Transportation (Non-Medical): No  Physical Activity: Insufficiently Active (11/11/2022)   Exercise Vital Sign    Days of Exercise per Week: 3 days    Minutes of Exercise per Session: 10 min  Stress: Stress Concern Present (11/11/2022)   [employer] of Occupational Health - Occupational Stress Questionnaire    Feeling of Stress : Rather much  Social Connections: Unknown (11/11/2022)   Social Connection and Isolation Panel [NHANES]     Frequency of Communication with Friends and Family: More than three times a week    Frequency of Social Gatherings with Friends and Family: More than three times a week    Attends Religious Services: Patient declined    Database administrator or Organizations: No    Attends Engineer, structural: Not on file    Marital Status: Married    Tobacco Counseling Counseling given: Not Answered   Clinical Intake:  Pre-visit preparation completed: Yes  Pain : No/denies pain     BMI - recorded: 26.61 Nutritional Status: BMI 25 -29 Overweight Nutritional Risks: None Diabetes: No  How often do you need to have someone help you when you read instructions, pamphlets, or other written materials from your doctor or pharmacy?: 1 - Never  Interpreter Needed?: No  Information entered by :: Tracie Lindbloom, RMA   Activities of Daily Living    11/11/2022   10:54 AM  In your present state of health, do you have any difficulty performing the following activities:  Hearing? 0  Vision? 0  Difficulty concentrating or making decisions? 0  Walking or climbing stairs? 0  Dressing or bathing? 0  Doing errands, shopping? 0  Preparing Food and eating ? N  Using the Toilet? N  In the past six months, have you accidently leaked urine? N  Do you have problems with loss of bowel control? N  Managing your Medications? N  Managing your Finances? Y  Housekeeping or managing your Housekeeping? N    Patient Care Team: Elenore Paddy, NP as PCP - General (Nurse Practitioner) Shea Evans, [facility])  Indicate any recent Medical Services you may have received from other than [facility] providers in the past year (date may be approximate).     Assessment:   This is a routine wellness examination for Carla Welch.  Hearing/Vision screen Hearing Screening - Comments:: Denies hearing difficulties   Vision Screening - Comments:: Denies vision issues.   Goals Addressed             This Visit's  Progress    Patient Stated   On track    Start to eat healthier and drink more water.      Depression Screen    11/14/2022    3:29 PM 10/03/2022    8:48 AM 11/06/2021    3:31 PM 05/17/2021    8:51 AM 10/20/2020    3:01 PM 10/27/2018   11:52 AM 07/10/2017    9:13 AM  PHQ 2/9 Scores  PHQ - 2 Score 0 6 0 0 0 0 0  PHQ- 9 Score 0 9  4       Fall Risk    11/11/2022   10:54 AM 10/03/2022    8:46 AM 11/06/2021    3:37 PM 05/17/2021    8:52 AM 10/20/2020    3:02 PM  Fall Risk   Falls in the past year? 0 0 0 0 0  Number falls in  past yr:  0 0  0  Injury with Fall?  0 0  0  Risk for fall due to :  No Fall Risks No Fall Risks    Follow up Falls evaluation completed;Falls prevention discussed Falls evaluation completed Falls evaluation completed      MEDICARE RISK AT HOME: Medicare Risk at Home Any stairs in or around the home?: No Home free of loose throw rugs in walkways, pet beds, electrical cords, etc?: Yes Adequate lighting in your home to reduce risk of falls?: Yes Life alert?: No Use of a cane, walker or w/c?: No Grab bars in the bathroom?: No Shower chair or bench in shower?: No Elevated toilet seat or a handicapped toilet?: No  TIMED UP AND GO:  Was the test performed?  No    Cognitive Function:        11/14/2022    3:27 PM 11/06/2021    3:38 PM 11/11/2019    9:18 AM 10/27/2018   11:52 AM  6CIT Screen  What Year? 0 points 0 points 0 points 0 points  What month? 0 points 0 points 0 points 0 points  What time? 0 points 0 points 0 points 0 points  Count back from 20 0 points 0 points 0 points 0 points  Months in reverse 0 points 0 points 0 points 0 points  Repeat phrase 0 points 0 points 0 points 0 points  Total Score 0 points 0 points 0 points 0 points    Immunizations Immunization History  Administered Date(s) Administered   Fluad Quad(high Dose 65+) 10/27/2018   Pneumococcal Conjugate-13 06/14/2015   Pneumococcal Polysaccharide-23 07/10/2017   Tdap  06/08/2014   Zoster, Live 08/18/2014    TDAP status: Up to date  Flu Vaccine status: Due, Education has been provided regarding the importance of this vaccine. Advised may receive this vaccine at local pharmacy or Health Dept. Aware to provide a copy of the vaccination record if obtained from local pharmacy or Health Dept. Verbalized acceptance and understanding.  Pneumococcal vaccine status: Up to date  Covid-19 vaccine status: Declined, Education has been provided regarding the importance of this vaccine but patient still declined. Advised may receive this vaccine at local pharmacy or Health Dept.or vaccine clinic. Aware to provide a copy of the vaccination record if obtained from local pharmacy or Health Dept. Verbalized acceptance and understanding.  Qualifies for Shingles Vaccine? Yes   Zostavax completed Yes   Shingrix Completed?: No.    Education has been provided regarding the importance of this vaccine. Patient has been advised to call insurance company to determine out of pocket expense if they have not yet received this vaccine. Advised may also receive vaccine at local pharmacy or Health Dept. Verbalized acceptance and understanding.  Screening Tests Health Maintenance  Topic Date Due   Fecal DNA (Cologuard)  Never done   Zoster Vaccines- Shingrix (1 of 2) 11/20/1999   COVID-19 Vaccine (1 - 2023-24 season) 11/30/2022 (Originally 09/15/2022)   INFLUENZA VACCINE  04/14/2023 (Originally 08/15/2022)   Medicare Annual Wellness (AWV)  11/14/2023   DTaP/Tdap/Td (2 - Td or Tdap) 06/07/2024   MAMMOGRAM  10/31/2024   Pneumonia Vaccine 81+ Years old  Completed   DEXA SCAN  Completed   Hepatitis C Screening  Completed   HPV VACCINES  Aged Out   Colonoscopy  Discontinued    Health Maintenance  Health Maintenance Due  Topic Date Due   Fecal DNA (Cologuard)  Never done   Zoster Vaccines- Shingrix (1  of 2) 11/20/1999    Mammogram status: Completed 11/01/2022. Repeat every  year   Lung Cancer Screening: (Low Dose CT Chest recommended if Age 61-80 years, 20 pack-year currently smoking OR have quit w/in 15years.) does not qualify.   Lung Cancer Screening Referral: N/A  Additional Screening:  Hepatitis C Screening: does qualify; Completed 07/10/2017  Vision Screening: Recommended annual ophthalmology exams for early detection of glaucoma and other disorders of the eye. Is the patient up to date with their annual eye exam?  Yes  Who is the provider or what is the name of the office in which the patient attends annual eye exams? Dr. Shea Evans If pt is not established with a provider, would they like to be referred to a provider to establish care? Yes .   Dental Screening: Recommended annual dental exams for proper oral hygiene   Community Resource Referral / Chronic Care Management: CRR required this visit?  No   CCM required this visit?  No     Plan:     I have personally reviewed and noted the following in the patient's chart:   Medical and social history Use of alcohol, tobacco or illicit drugs  Current medications and supplements including opioid prescriptions. Patient is not currently taking opioid prescriptions. Functional ability and status Nutritional status Physical activity Advanced directives List of other physicians Hospitalizations, surgeries, and ER visits in previous 12 months Vitals Screenings to include cognitive, depression, and falls Referrals and appointments  In addition, I have reviewed and discussed with patient certain preventive protocols, quality metrics, and best practice recommendations. A written personalized care plan for preventive services as well as general preventive health recommendations were provided to patient.     Lotta Frankenfield L Mike Hamre, CMA   11/14/2022   After Visit Summary: (MyChart) Due to this being a telephonic visit, the after visit summary with patients personalized plan was offered to patient via MyChart    Nurse Notes: Patient is due for a Flu vaccine.  She declines Covid vaccine.  She is due for a DEXA, which she would like to discuss further with Maralyn Sago during her up coming visit.  Patient wanted to also discuss her doing the Cologuard as well.  She had no other concerns to address today.    Medical screening examination/treatment/procedure(s) were performed by non-physician practitioner and as supervising physician I was immediately available for consultation/collaboration.  I agree with above. Jacinta Shoe, MD

## 2022-11-14 NOTE — Patient Instructions (Signed)
Ms. Parilla , Thank you for taking time to come for your Medicare Wellness Visit. I appreciate your ongoing commitment to your health goals. Please review the following plan we discussed and let me know if I can assist you in the future.   Referrals/Orders/Follow-Ups/Clinician Recommendations: You are due for a Bone Density and a Colonoscopy.    This is a list of the screening recommended for you and due dates:  Health Maintenance  Topic Date Due   Cologuard (Stool DNA test)  Never done   Zoster (Shingles) Vaccine (1 of 2) 11/20/1999   COVID-19 Vaccine (1 - 2023-24 season) 11/30/2022*   Flu Shot  04/14/2023*   Medicare Annual Wellness Visit  11/14/2023   DTaP/Tdap/Td vaccine (2 - Td or Tdap) 06/07/2024   Mammogram  10/31/2024   Pneumonia Vaccine  Completed   DEXA scan (bone density measurement)  Completed   Hepatitis C Screening  Completed   HPV Vaccine  Aged Out   Colon Cancer Screening  Discontinued  *Topic was postponed. The date shown is not the original due date.    Advanced directives: (Copy Requested) Please bring a copy of your health care power of attorney and living will to the office to be added to your chart at your convenience.  Next Medicare Annual Wellness Visit scheduled for next year: Yes

## 2022-11-20 ENCOUNTER — Other Ambulatory Visit: Payer: Medicare Other

## 2022-11-20 DIAGNOSIS — E785 Hyperlipidemia, unspecified: Secondary | ICD-10-CM

## 2022-11-20 LAB — COMPREHENSIVE METABOLIC PANEL
ALT: 11 U/L (ref 0–35)
AST: 16 U/L (ref 0–37)
Albumin: 4.4 g/dL (ref 3.5–5.2)
Alkaline Phosphatase: 83 U/L (ref 39–117)
BUN: 11 mg/dL (ref 6–23)
CO2: 29 meq/L (ref 19–32)
Calcium: 9.5 mg/dL (ref 8.4–10.5)
Chloride: 99 meq/L (ref 96–112)
Creatinine, Ser: 0.75 mg/dL (ref 0.40–1.20)
GFR: 79.22 mL/min (ref >60.00)
Glucose, Bld: 95 mg/dL (ref 70–99)
Potassium: 3.7 meq/L (ref 3.5–5.1)
Sodium: 135 meq/L (ref 135–145)
Total Bilirubin: 0.6 mg/dL (ref 0.2–1.2)
Total Protein: 7.5 g/dL (ref 6.0–8.3)

## 2022-11-20 LAB — LIPID PANEL
Cholesterol: 137 mg/dL (ref 0–200)
HDL: 52.8 mg/dL (ref >39.00)
LDL Cholesterol: 63 mg/dL (ref 0–99)
NonHDL: 84.47
Total CHOL/HDL Ratio: 3
Triglycerides: 105 mg/dL (ref 0.0–149.0)
VLDL: 21 mg/dL (ref 0.0–40.0)

## 2022-11-28 ENCOUNTER — Ambulatory Visit: Payer: Medicare Other | Admitting: Nurse Practitioner

## 2022-11-28 VITALS — BP 134/80 | HR 78 | Temp 97.8°F | Ht 68.0 in | Wt 178.5 lb

## 2022-11-28 DIAGNOSIS — E785 Hyperlipidemia, unspecified: Secondary | ICD-10-CM

## 2022-11-28 NOTE — Progress Notes (Signed)
   Established Patient Office Visit  Subjective   Patient ID: Carla Welch, female    DOB: 1950-05-28  Age: 72 y.o. MRN: [id number]  Chief Complaint  Patient presents with   Medical Management of Chronic Issues    Follow up     HLD: started rosuvastatin at last office visit. Tolerating well. LDL dropped from 120 to 63. Kidney function stable, liver enzymes stable.     Review of Systems  Eyes:  Negative for blurred vision.  Cardiovascular:  Negative for chest pain.  Neurological:  Negative for headaches.      Objective:     BP 134/80   Pulse 78   Temp 97.8 F (36.6 C) (Temporal)   Ht 5\' 8"  (1.727 m)   Wt 178 lb 8 oz (81 kg)   SpO2 93%   BMI 27.14 kg/m  BP Readings from Last 3 Encounters:  11/28/22 134/80  10/03/22 134/76  05/17/21 134/82   Wt Readings from Last 3 Encounters:  11/28/22 178 lb 8 oz (81 kg)  11/14/22 175 lb (79.4 kg)  10/03/22 176 lb (79.8 kg)      Physical Exam Vitals reviewed.  Constitutional:      General: She is not in acute distress.    Appearance: Normal appearance.  HENT:     Head: Normocephalic and atraumatic.  Neck:     Vascular: No carotid bruit.  Cardiovascular:     Rate and Rhythm: Normal rate and regular rhythm.     Pulses: Normal pulses.     Heart sounds: Normal heart sounds.  Pulmonary:     Effort: Pulmonary effort is normal.     Breath sounds: Normal breath sounds.  Skin:    General: Skin is warm and dry.  Neurological:     General: No focal deficit present.     Mental Status: She is alert and oriented to person, place, and time.  Psychiatric:        Mood and Affect: Mood normal.        Behavior: Behavior normal.        Judgment: Judgment normal.      No results found for any visits on 11/28/22.    The 10-year ASCVD risk score (Arnett DK, et al., 2019) is: 17.6%    Assessment & Plan:   Problem List Items Addressed This Visit       Other   Hyperlipidemia - Primary    Chronic Much improved with  initiation of rosuvastatin. Continue rosuvastatin 5mg /day. F/u in 3-6 months or sooner as needed.        Return in about 3 months (around 02/28/2023) for 3-6 months F/U with Maralyn Sago.    Elenore Paddy, NP

## 2022-11-28 NOTE — Assessment & Plan Note (Signed)
Chronic Much improved with initiation of rosuvastatin. Continue rosuvastatin 5mg /day. F/u in 3-6 months or sooner as needed.

## 2022-12-03 ENCOUNTER — Telehealth: Payer: Self-pay | Admitting: Nurse Practitioner

## 2022-12-03 NOTE — Telephone Encounter (Signed)
Patient's bill for 10/03/22 was coded wrong - they did a physical but it shows as an office visit.  It shows that she has medicare part a and b - but she states that she also has [employer] - part F.  Patient would like for you to call her.  She has already spoke to billing.   [phone]

## 2023-01-11 ENCOUNTER — Other Ambulatory Visit: Payer: Self-pay | Admitting: Nurse Practitioner

## 2023-01-11 DIAGNOSIS — E785 Hyperlipidemia, unspecified: Secondary | ICD-10-CM

## 2023-05-22 ENCOUNTER — Telehealth: Payer: Self-pay | Admitting: Nurse Practitioner

## 2023-05-22 NOTE — Telephone Encounter (Signed)
 Copied from CRM [id number]. Topic: Clinical - Request for Lab/Test Order >> May 22, 2023  3:50 PM [name] wrote: Reason for CRM: Patient is wanting to get her labs done a week before her appt on June 5th

## 2023-05-23 ENCOUNTER — Other Ambulatory Visit: Payer: Self-pay | Admitting: Nurse Practitioner

## 2023-05-23 DIAGNOSIS — I10 Essential (primary) hypertension: Secondary | ICD-10-CM

## 2023-05-23 DIAGNOSIS — E785 Hyperlipidemia, unspecified: Secondary | ICD-10-CM

## 2023-05-29 ENCOUNTER — Ambulatory Visit: Payer: Medicare Other | Admitting: Nurse Practitioner

## 2023-06-11 ENCOUNTER — Other Ambulatory Visit (INDEPENDENT_AMBULATORY_CARE_PROVIDER_SITE_OTHER)

## 2023-06-11 DIAGNOSIS — I10 Essential (primary) hypertension: Secondary | ICD-10-CM

## 2023-06-11 DIAGNOSIS — E785 Hyperlipidemia, unspecified: Secondary | ICD-10-CM | POA: Diagnosis not present

## 2023-06-11 LAB — COMPREHENSIVE METABOLIC PANEL WITH GFR
ALT: 11 U/L (ref 0–35)
AST: 17 U/L (ref 0–37)
Albumin: 4.6 g/dL (ref 3.5–5.2)
Alkaline Phosphatase: 63 U/L (ref 39–117)
BUN: 17 mg/dL (ref 6–23)
CO2: 29 meq/L (ref 19–32)
Calcium: 9.4 mg/dL (ref 8.4–10.5)
Chloride: 99 meq/L (ref 96–112)
Creatinine, Ser: 0.79 mg/dL (ref 0.40–1.20)
GFR: 74.14 mL/min (ref >60.00)
Glucose, Bld: 99 mg/dL (ref 70–99)
Potassium: 3.7 meq/L (ref 3.5–5.1)
Sodium: 136 meq/L (ref 135–145)
Total Bilirubin: 0.7 mg/dL (ref 0.2–1.2)
Total Protein: 7.7 g/dL (ref 6.0–8.3)

## 2023-06-11 LAB — CBC
HCT: 42.6 % (ref 36.0–46.0)
Hemoglobin: 14.3 g/dL (ref 12.0–15.0)
MCHC: 33.6 g/dL (ref 30.0–36.0)
MCV: 93.2 fl (ref 78.0–100.0)
Platelets: 218 10*3/uL (ref 150.0–400.0)
RBC: 4.57 Mil/uL (ref 3.87–5.11)
RDW: 13.1 % (ref 11.5–15.5)
WBC: 9 10*3/uL (ref 4.0–10.5)

## 2023-06-11 LAB — LIPID PANEL
Cholesterol: 152 mg/dL (ref 0–200)
HDL: 54.7 mg/dL (ref >39.00)
LDL Cholesterol: 74 mg/dL (ref 0–99)
NonHDL: 97.78
Total CHOL/HDL Ratio: 3
Triglycerides: 117 mg/dL (ref 0.0–149.0)
VLDL: 23.4 mg/dL (ref 0.0–40.0)

## 2023-06-12 ENCOUNTER — Ambulatory Visit: Payer: Self-pay | Admitting: Nurse Practitioner

## 2023-06-19 ENCOUNTER — Ambulatory Visit: Admitting: Nurse Practitioner

## 2023-07-10 DIAGNOSIS — H26493 Other secondary cataract, bilateral: Secondary | ICD-10-CM | POA: Diagnosis not present

## 2023-08-07 ENCOUNTER — Ambulatory Visit: Admitting: Nurse Practitioner

## 2023-09-11 ENCOUNTER — Ambulatory Visit: Admitting: Nurse Practitioner

## 2023-09-19 ENCOUNTER — Ambulatory Visit: Admitting: Nurse Practitioner

## 2023-09-19 VITALS — BP 108/70 | HR 78 | Temp 98.5°F | Ht 68.0 in | Wt 185.1 lb

## 2023-09-19 DIAGNOSIS — E785 Hyperlipidemia, unspecified: Secondary | ICD-10-CM | POA: Diagnosis not present

## 2023-09-19 DIAGNOSIS — I10 Essential (primary) hypertension: Secondary | ICD-10-CM | POA: Diagnosis not present

## 2023-09-19 NOTE — Progress Notes (Signed)
   Established Patient Office Visit  Subjective   Patient ID: Carla Welch, female    DOB: 1951-01-13  Age: 73 y.o. MRN: [id number]  Chief Complaint  Patient presents with   Hyperlipidemia    Discussed the use of AI scribe software for clinical note transcription with the patient, who gave verbal consent to proceed.  History of Present Illness Carla Welch is a 73 year old female with hypertension and hyperlipidemia who presents for medication management and follow-up.  Hypertension - Continues hydrochlorothiazide  12.5 mg once daily - Tolerates medication well - No dizziness, headaches, or chest pain  Hyperlipidemia - Continues rosuvastatin  5 mg once daily - LDL cholesterol decreased from 120 mg/dL to 74 mg/dL since starting medication      Review of Systems  Respiratory:  Negative for shortness of breath.   Cardiovascular:  Negative for chest pain and palpitations.      Objective:     BP 108/70   Pulse 78   Temp 98.5 F (36.9 C) (Temporal)   Ht 5' 8 (1.727 m)   Wt 185 lb 2 oz (84 kg)   SpO2 97%   BMI 28.15 kg/m  BP Readings from Last 3 Encounters:  09/19/23 108/70  11/28/22 134/80  10/03/22 134/76   Wt Readings from Last 3 Encounters:  09/19/23 185 lb 2 oz (84 kg)  11/28/22 178 lb 8 oz (81 kg)  11/14/22 175 lb (79.4 kg)      Physical Exam Vitals reviewed.  Constitutional:      General: She is not in acute distress.    Appearance: Normal appearance.  HENT:     Head: Normocephalic and atraumatic.  Neck:     Vascular: No carotid bruit.  Cardiovascular:     Rate and Rhythm: Normal rate and regular rhythm.     Pulses: Normal pulses.     Heart sounds: Normal heart sounds.  Pulmonary:     Effort: Pulmonary effort is normal.     Breath sounds: Normal breath sounds.  Skin:    General: Skin is warm and dry.  Neurological:     General: No focal deficit present.     Mental Status: She is alert and oriented to person, place, and time.   Psychiatric:        Mood and Affect: Mood normal.        Behavior: Behavior normal.        Judgment: Judgment normal.      No results found for any visits on 09/19/23.    The 10-year ASCVD risk score (Arnett DK, et al., 2019) is: 12%    Assessment & Plan:   Problem List Items Addressed This Visit   None Assessment and Plan Assessment & Plan Essential hypertension Blood pressure controlled at 108/70 mmHg. Current medication effective. - Continue hydrochlorothiazide  12.5 mg oral daily.  Primary hyperlipidemia LDL cholesterol at goal, reduced to 74 mg/dL. Medication well-tolerated and effective. - Continue rosuvastatin  5 mg oral daily. - Schedule lab work in late May or early June 2026.  *declines flu shot today *declines colon cancer screening today  Return in about 9 months (around 06/18/2024), or End of May/Early June, for F/U with Lauraine.    Lauraine FORBES Pereyra, NP

## 2023-09-19 NOTE — Assessment & Plan Note (Signed)
 Primary hyperlipidemia LDL cholesterol at goal, reduced to 74 mg/dL. Medication well-tolerated and effective. - Continue rosuvastatin  5 mg oral daily. - Schedule lab work in late May or early June 2026.

## 2023-09-19 NOTE — Assessment & Plan Note (Signed)
 Essential hypertension Blood pressure controlled at 108/70 mmHg. Current medication effective. - Continue hydrochlorothiazide  12.5 mg oral daily.

## 2023-10-02 ENCOUNTER — Other Ambulatory Visit: Payer: Self-pay | Admitting: Nurse Practitioner

## 2023-10-02 DIAGNOSIS — E785 Hyperlipidemia, unspecified: Secondary | ICD-10-CM

## 2023-10-02 DIAGNOSIS — I10 Essential (primary) hypertension: Secondary | ICD-10-CM

## 2023-10-14 DIAGNOSIS — H18413 Arcus senilis, bilateral: Secondary | ICD-10-CM | POA: Diagnosis not present

## 2023-10-14 DIAGNOSIS — Z961 Presence of intraocular lens: Secondary | ICD-10-CM | POA: Diagnosis not present

## 2023-10-14 DIAGNOSIS — H43822 Vitreomacular adhesion, left eye: Secondary | ICD-10-CM | POA: Diagnosis not present

## 2023-10-14 DIAGNOSIS — H26491 Other secondary cataract, right eye: Secondary | ICD-10-CM | POA: Diagnosis not present

## 2023-10-14 DIAGNOSIS — H26493 Other secondary cataract, bilateral: Secondary | ICD-10-CM | POA: Diagnosis not present

## 2023-11-18 ENCOUNTER — Telehealth: Payer: Self-pay

## 2023-11-18 NOTE — Telephone Encounter (Signed)
 Copied from CRM [id number]. Topic: Appointments - Appointment Scheduling >> Nov 18, 2023  9:57 AM Carla Welch wrote: Pt states she was told the AWV today was in person and the person who rescheduled the original appt told her it was going to be changed to this date specifically so she could do it over the phone since she is at work.She states she was waiting for a call for 30 minutes. As this was 30 minutes past the appt time, I was not able to reschedule. When I attempted to book a new appt for her for AWV on the phone, the decision tree would not allow me to book over the phone, even though CAL confirmed it was okay to do a phone appt for this patient. When I went to go back to patient, she disconnected the line.  Please call patient to reschedule a phone appt for her AWV.

## 2023-12-05 ENCOUNTER — Ambulatory Visit (INDEPENDENT_AMBULATORY_CARE_PROVIDER_SITE_OTHER)

## 2023-12-05 VITALS — BP 120/68 | HR 82 | Ht 68.0 in | Wt 184.0 lb

## 2023-12-05 DIAGNOSIS — Z0000 Encounter for general adult medical examination without abnormal findings: Secondary | ICD-10-CM

## 2023-12-05 NOTE — Patient Instructions (Addendum)
 Carla Welch,  Thank you for taking the time for your Medicare Wellness Visit. I appreciate your continued commitment to your health goals. Please review the care plan we discussed, and feel free to reach out if I can assist you further.  Please note that Annual Wellness Visits do not include a physical exam. Some assessments may be limited, especially if the visit was conducted virtually. If needed, we may recommend an in-person follow-up with your provider.  Ongoing Care Seeing your primary care provider every 3 to 6 months helps us  monitor your health and provide consistent, personalized care.   Referrals If a referral was made during today's visit and you haven't received any updates within two weeks, please contact the referred provider directly to check on the status.  Recommended Screenings:  Health Maintenance  Topic Date Due   Zoster (Shingles) Vaccine (1 of 2) 11/20/1999   COVID-19 Vaccine (1 - 2025-26 season) Never done   Flu Shot  04/13/2024*   Cologuard (Stool DNA test)  09/18/2024*   DTaP/Tdap/Td vaccine (2 - Td or Tdap) 06/07/2024   Breast Cancer Screening  10/31/2024   Medicare Annual Wellness Visit  12/04/2024   Pneumococcal Vaccine for age over 10  Completed   Osteoporosis screening with Bone Density Scan  Completed   Hepatitis C Screening  Completed   Meningitis B Vaccine  Aged Out   Colon Cancer Screening  Discontinued  *Topic was postponed. The date shown is not the original due date.       12/05/2023   11:48 AM  Advanced Directives  Does Patient Have a Medical Advance Directive? No  Would patient like information on creating a medical advance directive? No - Patient declined    Vision: Annual vision screenings are recommended for early detection of glaucoma, cataracts, and diabetic retinopathy. These exams can also reveal signs of chronic conditions such as diabetes and high blood pressure.  Dental: Annual dental screenings help detect early signs of oral  cancer, gum disease, and other conditions linked to overall health, including heart disease and diabetes.

## 2023-12-05 NOTE — Progress Notes (Signed)
 Chief Complaint  Patient presents with   Medicare Wellness     Subjective:   Carla Welch is a 73 y.o. female who presents for a Medicare Annual Wellness Visit.  Allergies (verified) Morphine and codeine and Hydrochlorothiazide    History: Past Medical History:  Diagnosis Date   Hyperlipidemia    Hypertension    IBS (irritable bowel syndrome)    Past Surgical History:  Procedure Laterality Date   ABDOMINAL HYSTERECTOMY     COSMETIC SURGERY     Family History  Problem Relation Age of Onset   Cancer Mother        lung   Hypertension Maternal Grandmother    Social History   Occupational History   Occupation: Print production planner  Tobacco Use   Smoking status: Never   Smokeless tobacco: Never  Vaping Use   Vaping status: Never Used  Substance and Sexual Activity   Alcohol use: No   Drug use: No   Sexual activity: Yes   Tobacco Counseling Counseling given: Not Answered  SDOH Screenings   Food Insecurity: No Food Insecurity (12/05/2023)  Housing: Unknown (12/05/2023)  Transportation Needs: No Transportation Needs (12/05/2023)  Utilities: Not At Risk (12/05/2023)  Alcohol Screen: Low Risk  (11/06/2021)  Depression (PHQ2-9): Low Risk  (12/05/2023)  Financial Resource Strain: Low Risk  (11/17/2023)  Physical Activity: Sufficiently Active (12/05/2023)  Recent Concern: Physical Activity - Insufficiently Active (11/17/2023)  Social Connections: Moderately Isolated (12/05/2023)  Stress: No Stress Concern Present (12/05/2023)  Recent Concern: Stress - Stress Concern Present (11/17/2023)  Tobacco Use: Low Risk  (12/05/2023)  Health Literacy: Adequate Health Literacy (12/05/2023)   See flowsheets for full screening details  Depression Screen PHQ 2 & 9 Depression Scale- Over the past 2 weeks, how often have you been bothered by any of the following problems? Little interest or pleasure in doing things: 0 Feeling down, depressed, or hopeless (PHQ Adolescent also  includes...irritable): 0 PHQ-2 Total Score: 0 Trouble falling or staying asleep, or sleeping too much: 0 Feeling tired or having little energy: 0 Poor appetite or overeating (PHQ Adolescent also includes...weight loss): 0 Feeling bad about yourself - or that you are a failure or have let yourself or your family down: 0 Trouble concentrating on things, such as reading the newspaper or watching television (PHQ Adolescent also includes...like school work): 0 Moving or speaking so slowly that other people could have noticed. Or the opposite - being so fidgety or restless that you have been moving around a lot more than usual: 0 Thoughts that you would be better off dead, or of hurting yourself in some way: 0 PHQ-9 Total Score: 0 If you checked off any problems, how difficult have these problems made it for you to do your work, take care of things at home, or get along with other people?: Not difficult at all  Depression Treatment Depression Interventions/Treatment : EYV7-0 Score <4 Follow-up Not Indicated     Goals Addressed               This Visit's Progress     Patient Stated (pt-stated)        Patient stated she plans to continue staying busy - still works       Visit info / Clinical Intake: Medicare Wellness Visit Type:: Subsequent Annual Wellness Visit Persons participating in visit:: patient Medicare Wellness Visit Mode:: In-person (required for [employer]) Information given by:: patient Interpreter Needed?: No Pre-visit prep was completed: yes AWV questionnaire completed by patient prior to  visit?: yes Date:: 12/01/23 Living arrangements:: lives with spouse/significant other Patient's Overall Health Status Rating: good Typical amount of pain: none Does pain affect daily life?: no Are you currently prescribed opioids?: no  Dietary Habits and Nutritional Risks How many meals a day?: 2 Eats fruit and vegetables daily?: yes Most meals are obtained by: preparing own meals In  the last 2 weeks, have you had any of the following?: none Diabetic:: no  Functional Status Activities of Daily Living (to include ambulation/medication): Independent Ambulation: Independent Medication Administration: Independent Home Management: Independent Manage your own finances?: yes Primary transportation is: driving Concerns about vision?: no *vision screening is required for WT[facility]* Concerns about hearing?: no  Fall Screening Falls in the past year?: 0 Number of falls in past year: 0 Was there an injury with Fall?: 0 Fall Risk Category Calculator: 0 Patient Fall Risk Level: Low Fall Risk  Fall Risk Patient at Risk for Falls Due to: No Fall Risks Fall risk Follow up: Falls evaluation completed; Falls prevention discussed  Home and Transportation Safety: All rugs have non-skid backing?: N/A, no rugs All stairs or steps have railings?: (!) no (outside) Grab bars in the bathtub or shower?: (!) no Have non-skid surface in bathtub or shower?: yes Good home lighting?: yes Regular seat belt use?: yes Hospital stays in the last year:: no  Cognitive Assessment Difficulty concentrating, remembering, or making decisions? : no Will 6CIT or Mini Cog be Completed: yes What year is it?: 0 points What month is it?: 0 points Give patient an address phrase to remember (5 components): [address] [place], [place] About what time is it?: 0 points Count backwards from 20 to 1: 0 points Say the months of the year in reverse: 0 points Repeat the address phrase from earlier: 0 points 6 CIT Score: 0 points  Advance Directives (For Healthcare) Does Patient Have a Medical Advance Directive?: No Would patient like information on creating a medical advance directive?: No - Patient declined  Reviewed/Updated  Reviewed/Updated: Reviewed All (Medical, Surgical, Family, Medications, Allergies, Care Teams, Patient Goals)        Objective:    Today's Vitals   12/05/23 1153  BP: 120/68   Pulse: 82  SpO2: 98%  Weight: 184 lb (83.5 kg)  Height: 5' 8 (1.727 m)   Body mass index is 27.98 kg/m.  Current Medications (verified) Outpatient Encounter Medications as of 12/05/2023  Medication Sig   hydrochlorothiazide  (MICROZIDE ) 12.5 MG capsule TAKE 1 CAPSULE(12.5 MG) BY MOUTH DAILY   omega-3 acid ethyl esters (LOVAZA) 1 g capsule Take 1 g by mouth daily.   rosuvastatin  (CRESTOR ) 5 MG tablet TAKE 1 TABLET(5 MG) BY MOUTH DAILY   No facility-administered encounter medications on file as of 12/05/2023.   Hearing/Vision screen Hearing Screening - Comments:: Denies hearing difficulties   Vision Screening - Comments:: Denies vision concerns Immunizations and Health Maintenance Health Maintenance  Topic Date Due   Zoster Vaccines- Shingrix (1 of 2) 11/20/1999   COVID-19 Vaccine (1 - 2025-26 season) Never done   Influenza Vaccine  04/13/2024 (Originally 08/15/2023)   Fecal DNA (Cologuard)  09/18/2024 (Originally 11/20/1994)   DTaP/Tdap/Td (2 - Td or Tdap) 06/07/2024   Mammogram  10/31/2024   Medicare Annual Wellness (AWV)  12/04/2024   Pneumococcal Vaccine: 50+ Years  Completed   Bone Density Scan  Completed   Hepatitis C Screening  Completed   Meningococcal B Vaccine  Aged Out   Colonoscopy  Discontinued  Assessment/Plan:  This is a routine wellness examination for Lynore.  Patient Care Team: Elnor Lauraine BRAVO, NP as PCP - General (Nurse Practitioner) Abigail Maude POUR [facility])  I have personally reviewed and noted the following in the patient's chart:   Medical and social history Use of alcohol, tobacco or illicit drugs  Current medications and supplements including opioid prescriptions. Functional ability and status Nutritional status Physical activity Advanced directives List of other physicians Hospitalizations, surgeries, and ER visits in previous 12 months Vitals Screenings to include cognitive, depression, and falls Referrals and appointments  No  orders of the defined types were placed in this encounter.  In addition, I have reviewed and discussed with patient certain preventive protocols, quality metrics, and best practice recommendations. A written personalized care plan for preventive services as well as general preventive health recommendations were provided to patient.   Verdie CHRISTELLA Saba, CMA   12/05/2023   Return in 1 year (on 12/04/2024).  After Visit Summary: (In Person-Declined) Patient declined AVS at this time.  Nurse Notes: Scheduled 2026 AWV/CPE appts.

## 2024-01-04 ENCOUNTER — Other Ambulatory Visit: Payer: Self-pay | Admitting: Nurse Practitioner

## 2024-01-04 DIAGNOSIS — E785 Hyperlipidemia, unspecified: Secondary | ICD-10-CM

## 2024-06-18 ENCOUNTER — Ambulatory Visit: Admitting: Nurse Practitioner

## 2024-12-17 ENCOUNTER — Ambulatory Visit

## 2024-12-17 ENCOUNTER — Encounter: Admitting: Nurse Practitioner
